# Patient Record
Sex: Male | Born: 1985 | Hispanic: Yes | Marital: Single | Smoking: Never smoker
Health system: Southern US, Community
[De-identification: ages and names within clinical notes are randomized; demographics above are authoritative.]

---

## 2015-10-01 DIAGNOSIS — Z6824 Body mass index (BMI) 24.0-24.9, adult: Secondary | ICD-10-CM | POA: Diagnosis not present

## 2015-10-01 DIAGNOSIS — T7840XA Allergy, unspecified, initial encounter: Secondary | ICD-10-CM | POA: Diagnosis not present

## 2015-10-01 DIAGNOSIS — B009 Herpesviral infection, unspecified: Secondary | ICD-10-CM | POA: Diagnosis not present

## 2016-01-29 DIAGNOSIS — F329 Major depressive disorder, single episode, unspecified: Secondary | ICD-10-CM | POA: Diagnosis not present

## 2016-01-29 DIAGNOSIS — F419 Anxiety disorder, unspecified: Secondary | ICD-10-CM | POA: Diagnosis not present

## 2016-02-05 DIAGNOSIS — F419 Anxiety disorder, unspecified: Secondary | ICD-10-CM | POA: Diagnosis not present

## 2016-02-05 DIAGNOSIS — F329 Major depressive disorder, single episode, unspecified: Secondary | ICD-10-CM | POA: Diagnosis not present

## 2016-02-12 DIAGNOSIS — F329 Major depressive disorder, single episode, unspecified: Secondary | ICD-10-CM | POA: Diagnosis not present

## 2016-02-12 DIAGNOSIS — F419 Anxiety disorder, unspecified: Secondary | ICD-10-CM | POA: Diagnosis not present

## 2016-02-19 DIAGNOSIS — F419 Anxiety disorder, unspecified: Secondary | ICD-10-CM | POA: Diagnosis not present

## 2016-02-19 DIAGNOSIS — F329 Major depressive disorder, single episode, unspecified: Secondary | ICD-10-CM | POA: Diagnosis not present

## 2016-03-03 DIAGNOSIS — M5408 Panniculitis affecting regions of neck and back, sacral and sacrococcygeal region: Secondary | ICD-10-CM | POA: Diagnosis not present

## 2016-03-03 DIAGNOSIS — M9903 Segmental and somatic dysfunction of lumbar region: Secondary | ICD-10-CM | POA: Diagnosis not present

## 2016-03-03 DIAGNOSIS — M9902 Segmental and somatic dysfunction of thoracic region: Secondary | ICD-10-CM | POA: Diagnosis not present

## 2016-03-03 DIAGNOSIS — M62838 Other muscle spasm: Secondary | ICD-10-CM | POA: Diagnosis not present

## 2016-03-04 DIAGNOSIS — F419 Anxiety disorder, unspecified: Secondary | ICD-10-CM | POA: Diagnosis not present

## 2016-03-04 DIAGNOSIS — F329 Major depressive disorder, single episode, unspecified: Secondary | ICD-10-CM | POA: Diagnosis not present

## 2016-03-10 DIAGNOSIS — M62838 Other muscle spasm: Secondary | ICD-10-CM | POA: Diagnosis not present

## 2016-03-10 DIAGNOSIS — M5408 Panniculitis affecting regions of neck and back, sacral and sacrococcygeal region: Secondary | ICD-10-CM | POA: Diagnosis not present

## 2016-03-10 DIAGNOSIS — M9902 Segmental and somatic dysfunction of thoracic region: Secondary | ICD-10-CM | POA: Diagnosis not present

## 2016-03-10 DIAGNOSIS — M9903 Segmental and somatic dysfunction of lumbar region: Secondary | ICD-10-CM | POA: Diagnosis not present

## 2016-03-11 DIAGNOSIS — F419 Anxiety disorder, unspecified: Secondary | ICD-10-CM | POA: Diagnosis not present

## 2016-03-11 DIAGNOSIS — F329 Major depressive disorder, single episode, unspecified: Secondary | ICD-10-CM | POA: Diagnosis not present

## 2016-03-16 DIAGNOSIS — J3089 Other allergic rhinitis: Secondary | ICD-10-CM | POA: Diagnosis not present

## 2016-03-16 DIAGNOSIS — J029 Acute pharyngitis, unspecified: Secondary | ICD-10-CM | POA: Diagnosis not present

## 2016-03-17 DIAGNOSIS — M62838 Other muscle spasm: Secondary | ICD-10-CM | POA: Diagnosis not present

## 2016-03-17 DIAGNOSIS — M5408 Panniculitis affecting regions of neck and back, sacral and sacrococcygeal region: Secondary | ICD-10-CM | POA: Diagnosis not present

## 2016-03-17 DIAGNOSIS — M9902 Segmental and somatic dysfunction of thoracic region: Secondary | ICD-10-CM | POA: Diagnosis not present

## 2016-03-17 DIAGNOSIS — M9903 Segmental and somatic dysfunction of lumbar region: Secondary | ICD-10-CM | POA: Diagnosis not present

## 2016-03-31 DIAGNOSIS — M62838 Other muscle spasm: Secondary | ICD-10-CM | POA: Diagnosis not present

## 2016-03-31 DIAGNOSIS — M5408 Panniculitis affecting regions of neck and back, sacral and sacrococcygeal region: Secondary | ICD-10-CM | POA: Diagnosis not present

## 2016-03-31 DIAGNOSIS — M9903 Segmental and somatic dysfunction of lumbar region: Secondary | ICD-10-CM | POA: Diagnosis not present

## 2016-03-31 DIAGNOSIS — M9902 Segmental and somatic dysfunction of thoracic region: Secondary | ICD-10-CM | POA: Diagnosis not present

## 2016-04-01 DIAGNOSIS — F419 Anxiety disorder, unspecified: Secondary | ICD-10-CM | POA: Diagnosis not present

## 2016-04-01 DIAGNOSIS — F329 Major depressive disorder, single episode, unspecified: Secondary | ICD-10-CM | POA: Diagnosis not present

## 2016-04-07 DIAGNOSIS — M9902 Segmental and somatic dysfunction of thoracic region: Secondary | ICD-10-CM | POA: Diagnosis not present

## 2016-04-07 DIAGNOSIS — M62838 Other muscle spasm: Secondary | ICD-10-CM | POA: Diagnosis not present

## 2016-04-07 DIAGNOSIS — M5408 Panniculitis affecting regions of neck and back, sacral and sacrococcygeal region: Secondary | ICD-10-CM | POA: Diagnosis not present

## 2016-04-07 DIAGNOSIS — M9903 Segmental and somatic dysfunction of lumbar region: Secondary | ICD-10-CM | POA: Diagnosis not present

## 2016-04-14 DIAGNOSIS — M9902 Segmental and somatic dysfunction of thoracic region: Secondary | ICD-10-CM | POA: Diagnosis not present

## 2016-04-14 DIAGNOSIS — M62838 Other muscle spasm: Secondary | ICD-10-CM | POA: Diagnosis not present

## 2016-04-14 DIAGNOSIS — M5408 Panniculitis affecting regions of neck and back, sacral and sacrococcygeal region: Secondary | ICD-10-CM | POA: Diagnosis not present

## 2016-04-14 DIAGNOSIS — M9903 Segmental and somatic dysfunction of lumbar region: Secondary | ICD-10-CM | POA: Diagnosis not present

## 2016-04-15 DIAGNOSIS — F419 Anxiety disorder, unspecified: Secondary | ICD-10-CM | POA: Diagnosis not present

## 2016-04-15 DIAGNOSIS — F329 Major depressive disorder, single episode, unspecified: Secondary | ICD-10-CM | POA: Diagnosis not present

## 2016-04-21 DIAGNOSIS — M9902 Segmental and somatic dysfunction of thoracic region: Secondary | ICD-10-CM | POA: Diagnosis not present

## 2016-04-21 DIAGNOSIS — M5408 Panniculitis affecting regions of neck and back, sacral and sacrococcygeal region: Secondary | ICD-10-CM | POA: Diagnosis not present

## 2016-04-21 DIAGNOSIS — M62838 Other muscle spasm: Secondary | ICD-10-CM | POA: Diagnosis not present

## 2016-04-21 DIAGNOSIS — M9903 Segmental and somatic dysfunction of lumbar region: Secondary | ICD-10-CM | POA: Diagnosis not present

## 2016-04-29 DIAGNOSIS — F329 Major depressive disorder, single episode, unspecified: Secondary | ICD-10-CM | POA: Diagnosis not present

## 2016-04-29 DIAGNOSIS — F419 Anxiety disorder, unspecified: Secondary | ICD-10-CM | POA: Diagnosis not present

## 2016-04-30 DIAGNOSIS — M62838 Other muscle spasm: Secondary | ICD-10-CM | POA: Diagnosis not present

## 2016-04-30 DIAGNOSIS — M9902 Segmental and somatic dysfunction of thoracic region: Secondary | ICD-10-CM | POA: Diagnosis not present

## 2016-04-30 DIAGNOSIS — M5408 Panniculitis affecting regions of neck and back, sacral and sacrococcygeal region: Secondary | ICD-10-CM | POA: Diagnosis not present

## 2016-04-30 DIAGNOSIS — M9903 Segmental and somatic dysfunction of lumbar region: Secondary | ICD-10-CM | POA: Diagnosis not present

## 2016-05-05 DIAGNOSIS — M9903 Segmental and somatic dysfunction of lumbar region: Secondary | ICD-10-CM | POA: Diagnosis not present

## 2016-05-05 DIAGNOSIS — M62838 Other muscle spasm: Secondary | ICD-10-CM | POA: Diagnosis not present

## 2016-05-05 DIAGNOSIS — M5408 Panniculitis affecting regions of neck and back, sacral and sacrococcygeal region: Secondary | ICD-10-CM | POA: Diagnosis not present

## 2016-05-05 DIAGNOSIS — M9902 Segmental and somatic dysfunction of thoracic region: Secondary | ICD-10-CM | POA: Diagnosis not present

## 2016-05-12 DIAGNOSIS — M5408 Panniculitis affecting regions of neck and back, sacral and sacrococcygeal region: Secondary | ICD-10-CM | POA: Diagnosis not present

## 2016-05-12 DIAGNOSIS — M62838 Other muscle spasm: Secondary | ICD-10-CM | POA: Diagnosis not present

## 2016-05-12 DIAGNOSIS — M9902 Segmental and somatic dysfunction of thoracic region: Secondary | ICD-10-CM | POA: Diagnosis not present

## 2016-05-12 DIAGNOSIS — M9903 Segmental and somatic dysfunction of lumbar region: Secondary | ICD-10-CM | POA: Diagnosis not present

## 2016-05-13 DIAGNOSIS — F329 Major depressive disorder, single episode, unspecified: Secondary | ICD-10-CM | POA: Diagnosis not present

## 2016-05-13 DIAGNOSIS — F419 Anxiety disorder, unspecified: Secondary | ICD-10-CM | POA: Diagnosis not present

## 2016-07-26 ENCOUNTER — Encounter (HOSPITAL_COMMUNITY)
Admission: EM | Disposition: A | Discharge status: Home / Self Care | Payer: Self-pay | Source: Home / Self Care | Attending: Emergency Medicine

## 2016-07-26 ENCOUNTER — Observation Stay (HOSPITAL_COMMUNITY)
Admission: EM | Admit: 2016-07-26 | Discharge: 2016-07-27 | Disposition: A | Discharge status: Home / Self Care | Payer: BLUE CROSS/BLUE SHIELD | Attending: General Surgery | Admitting: General Surgery

## 2016-07-26 ENCOUNTER — Emergency Department (HOSPITAL_COMMUNITY): Payer: BLUE CROSS/BLUE SHIELD

## 2016-07-26 ENCOUNTER — Emergency Department (HOSPITAL_COMMUNITY): Payer: BLUE CROSS/BLUE SHIELD | Admitting: Anesthesiology

## 2016-07-26 ENCOUNTER — Encounter (HOSPITAL_COMMUNITY): Payer: Self-pay | Admitting: Emergency Medicine

## 2016-07-26 DIAGNOSIS — K353 Acute appendicitis with localized peritonitis, without perforation or gangrene: Secondary | ICD-10-CM

## 2016-07-26 DIAGNOSIS — R10811 Right upper quadrant abdominal tenderness: Secondary | ICD-10-CM

## 2016-07-26 DIAGNOSIS — R109 Unspecified abdominal pain: Secondary | ICD-10-CM | POA: Diagnosis not present

## 2016-07-26 DIAGNOSIS — R1110 Vomiting, unspecified: Secondary | ICD-10-CM | POA: Diagnosis not present

## 2016-07-26 DIAGNOSIS — R1011 Right upper quadrant pain: Secondary | ICD-10-CM | POA: Diagnosis not present

## 2016-07-26 DIAGNOSIS — K37 Unspecified appendicitis: Secondary | ICD-10-CM | POA: Diagnosis not present

## 2016-07-26 DIAGNOSIS — R112 Nausea with vomiting, unspecified: Secondary | ICD-10-CM | POA: Diagnosis not present

## 2016-07-26 DIAGNOSIS — K3580 Unspecified acute appendicitis: Principal | ICD-10-CM | POA: Insufficient documentation

## 2016-07-26 HISTORY — PX: LAPAROSCOPIC APPENDECTOMY: SHX408

## 2016-07-26 LAB — COMPREHENSIVE METABOLIC PANEL
ALT: 47 U/L (ref 17–63)
AST: 32 U/L (ref 15–41)
Albumin: 4.7 g/dL (ref 3.5–5.0)
Alkaline Phosphatase: 68 U/L (ref 38–126)
Anion gap: 11 (ref 5–15)
BUN: 18 mg/dL (ref 6–20)
CO2: 22 mmol/L (ref 22–32)
Calcium: 9.8 mg/dL (ref 8.9–10.3)
Chloride: 104 mmol/L (ref 101–111)
Creatinine, Ser: 0.81 mg/dL (ref 0.61–1.24)
GFR calc Af Amer: >60 mL/min (ref >60)
GFR calc non Af Amer: >60 mL/min (ref >60)
Glucose, Bld: 127 mg/dL — ABNORMAL HIGH (ref 65–99)
Potassium: 3.6 mmol/L (ref 3.5–5.1)
Sodium: 137 mmol/L (ref 135–145)
Total Bilirubin: 0.5 mg/dL (ref 0.3–1.2)
Total Protein: 8.1 g/dL (ref 6.5–8.1)

## 2016-07-26 LAB — URINALYSIS, ROUTINE W REFLEX MICROSCOPIC
Bilirubin Urine: NEGATIVE
Glucose, UA: NEGATIVE mg/dL
Hgb urine dipstick: NEGATIVE
Ketones, ur: 5 mg/dL — AB
Leukocytes, UA: NEGATIVE
Nitrite: NEGATIVE
Protein, ur: NEGATIVE mg/dL
Specific Gravity, Urine: 1.027 (ref 1.005–1.030)
pH: 8 (ref 5.0–8.0)

## 2016-07-26 LAB — CBC
HCT: 44.6 % (ref 39.0–52.0)
Hemoglobin: 15.8 g/dL (ref 13.0–17.0)
MCH: 29.3 pg (ref 26.0–34.0)
MCHC: 35.4 g/dL (ref 30.0–36.0)
MCV: 82.7 fL (ref 78.0–100.0)
Platelets: 275 10*3/uL (ref 150–400)
RBC: 5.39 MIL/uL (ref 4.22–5.81)
RDW: 12.3 % (ref 11.5–15.5)
WBC: 22.2 10*3/uL — ABNORMAL HIGH (ref 4.0–10.5)

## 2016-07-26 LAB — LIPASE, BLOOD: Lipase: 31 U/L (ref 11–51)

## 2016-07-26 LAB — RAPID STREP SCREEN (MED CTR MEBANE ONLY): Streptococcus, Group A Screen (Direct): NEGATIVE

## 2016-07-26 SURGERY — APPENDECTOMY, LAPAROSCOPIC
Anesthesia: General | Site: Abdomen

## 2016-07-26 MED ORDER — ONDANSETRON HCL 4 MG/2ML IJ SOLN
4.0000 mg | Freq: Four times a day (QID) | INTRAMUSCULAR | Status: DC | PRN
  Administered 2016-07-27: 4 mg via INTRAVENOUS
  Filled 2016-07-26: qty 2

## 2016-07-26 MED ORDER — ROCURONIUM BROMIDE 50 MG/5ML IV SOSY
PREFILLED_SYRINGE | INTRAVENOUS | Status: AC
  Filled 2016-07-26: qty 15

## 2016-07-26 MED ORDER — 0.9 % SODIUM CHLORIDE (POUR BTL) OPTIME
TOPICAL | Status: DC | PRN
  Administered 2016-07-26: 1000 mL

## 2016-07-26 MED ORDER — SUCCINYLCHOLINE CHLORIDE 200 MG/10ML IV SOSY
PREFILLED_SYRINGE | INTRAVENOUS | Status: AC
  Filled 2016-07-26: qty 10

## 2016-07-26 MED ORDER — BUPIVACAINE HCL (PF) 0.25 % IJ SOLN
INTRAMUSCULAR | Status: AC
  Filled 2016-07-26: qty 30

## 2016-07-26 MED ORDER — SODIUM CHLORIDE 0.9 % IV BOLUS (SEPSIS)
1000.0000 mL | Freq: Once | INTRAVENOUS | Status: AC
  Administered 2016-07-26: 1000 mL via INTRAVENOUS

## 2016-07-26 MED ORDER — ROCURONIUM BROMIDE 100 MG/10ML IV SOLN
INTRAVENOUS | Status: DC | PRN
  Administered 2016-07-26: 35 mg via INTRAVENOUS

## 2016-07-26 MED ORDER — MIDAZOLAM HCL 2 MG/2ML IJ SOLN
INTRAMUSCULAR | Status: AC
  Filled 2016-07-26: qty 2

## 2016-07-26 MED ORDER — ONDANSETRON HCL 4 MG/2ML IJ SOLN
INTRAMUSCULAR | Status: DC | PRN
  Administered 2016-07-26: 4 mg via INTRAVENOUS

## 2016-07-26 MED ORDER — DOCUSATE SODIUM 100 MG PO CAPS
100.0000 mg | ORAL_CAPSULE | Freq: Two times a day (BID) | ORAL | Status: DC
  Administered 2016-07-26 – 2016-07-27 (×2): 100 mg via ORAL
  Filled 2016-07-26 (×2): qty 1

## 2016-07-26 MED ORDER — ONDANSETRON 4 MG PO TBDP: 4.0000 mg | ORAL_TABLET | Freq: Four times a day (QID) | ORAL | Status: DC | PRN

## 2016-07-26 MED ORDER — PIPERACILLIN-TAZOBACTAM 3.375 G IVPB
3.3750 g | Freq: Once | INTRAVENOUS | Status: AC
  Administered 2016-07-26: 3.375 g via INTRAVENOUS
  Filled 2016-07-26: qty 50

## 2016-07-26 MED ORDER — ENOXAPARIN SODIUM 40 MG/0.4ML ~~LOC~~ SOLN
40.0000 mg | SUBCUTANEOUS | Status: DC
  Administered 2016-07-27: 40 mg via SUBCUTANEOUS
  Filled 2016-07-26: qty 0.4

## 2016-07-26 MED ORDER — LIDOCAINE HCL (CARDIAC) 20 MG/ML IV SOLN
INTRAVENOUS | Status: DC | PRN
  Administered 2016-07-26: 60 mg via INTRAVENOUS

## 2016-07-26 MED ORDER — MORPHINE SULFATE (PF) 2 MG/ML IV SOLN: 2.0000 mg | INTRAVENOUS | Status: DC | PRN

## 2016-07-26 MED ORDER — ONDANSETRON HCL 4 MG/2ML IJ SOLN
INTRAMUSCULAR | Status: AC
  Filled 2016-07-26: qty 2

## 2016-07-26 MED ORDER — SODIUM CHLORIDE 0.9 % IR SOLN
Status: DC | PRN
  Administered 2016-07-26: 1000 mL

## 2016-07-26 MED ORDER — OXYCODONE HCL 5 MG PO TABS
5.0000 mg | ORAL_TABLET | ORAL | Status: DC | PRN
  Administered 2016-07-27: 10 mg via ORAL
  Filled 2016-07-26: qty 2

## 2016-07-26 MED ORDER — FENTANYL CITRATE (PF) 100 MCG/2ML IJ SOLN
INTRAMUSCULAR | Status: DC | PRN
  Administered 2016-07-26: 100 ug via INTRAVENOUS
  Administered 2016-07-26: 50 ug via INTRAVENOUS

## 2016-07-26 MED ORDER — SUGAMMADEX SODIUM 200 MG/2ML IV SOLN
INTRAVENOUS | Status: DC | PRN
  Administered 2016-07-26: 200 mg via INTRAVENOUS

## 2016-07-26 MED ORDER — BUPIVACAINE-EPINEPHRINE 0.25% -1:200000 IJ SOLN
INTRAMUSCULAR | Status: DC | PRN
  Administered 2016-07-26: 10 mL

## 2016-07-26 MED ORDER — LACTATED RINGERS IV SOLN
INTRAVENOUS | Status: DC | PRN
  Administered 2016-07-26 (×2): via INTRAVENOUS

## 2016-07-26 MED ORDER — FENTANYL CITRATE (PF) 100 MCG/2ML IJ SOLN
INTRAMUSCULAR | Status: AC
Start: 2016-07-26 — End: 2016-07-26
  Filled 2016-07-26: qty 4

## 2016-07-26 MED ORDER — PROPOFOL 10 MG/ML IV BOLUS
INTRAVENOUS | Status: DC | PRN
  Administered 2016-07-26: 125 mg via INTRAVENOUS

## 2016-07-26 MED ORDER — SUGAMMADEX SODIUM 200 MG/2ML IV SOLN
INTRAVENOUS | Status: AC
  Filled 2016-07-26: qty 4

## 2016-07-26 MED ORDER — SUCCINYLCHOLINE CHLORIDE 20 MG/ML IJ SOLN
INTRAMUSCULAR | Status: DC | PRN
  Administered 2016-07-26: 100 mg via INTRAVENOUS

## 2016-07-26 MED ORDER — LORATADINE 10 MG PO TABS
10.0000 mg | ORAL_TABLET | Freq: Every day | ORAL | Status: DC
  Administered 2016-07-26: 10 mg via ORAL
  Filled 2016-07-26: qty 1

## 2016-07-26 MED ORDER — POTASSIUM CHLORIDE IN NACL 20-0.45 MEQ/L-% IV SOLN
INTRAVENOUS | Status: DC
  Administered 2016-07-27 (×2): via INTRAVENOUS
  Filled 2016-07-26 (×4): qty 1000

## 2016-07-26 MED ORDER — LIDOCAINE 2% (20 MG/ML) 5 ML SYRINGE
INTRAMUSCULAR | Status: AC
  Filled 2016-07-26: qty 5

## 2016-07-26 MED ORDER — LORATADINE 10 MG PO TABS: 10.0000 mg | ORAL_TABLET | Freq: Every day | ORAL | Status: DC

## 2016-07-26 MED ORDER — MIDAZOLAM HCL 5 MG/5ML IJ SOLN
INTRAMUSCULAR | Status: DC | PRN
  Administered 2016-07-26: 2 mg via INTRAVENOUS

## 2016-07-26 MED ORDER — IOPAMIDOL (ISOVUE-300) INJECTION 61%
INTRAVENOUS | Status: AC
  Administered 2016-07-26: 100 mL
  Filled 2016-07-26: qty 100

## 2016-07-26 MED ORDER — HYDROMORPHONE HCL 1 MG/ML IJ SOLN
INTRAMUSCULAR | Status: AC
  Filled 2016-07-26: qty 1

## 2016-07-26 MED ORDER — PIPERACILLIN-TAZOBACTAM 3.375 G IVPB 30 MIN
3.3750 g | Freq: Once | INTRAVENOUS | Status: AC
  Administered 2016-07-26: 3.375 g via INTRAVENOUS
  Filled 2016-07-26: qty 50

## 2016-07-26 MED ORDER — HYDROCODONE-ACETAMINOPHEN 10-325 MG PO TABS
0.5000 | ORAL_TABLET | ORAL | Status: DC | PRN
  Administered 2016-07-26 – 2016-07-27 (×2): 2 via ORAL
  Filled 2016-07-26 (×2): qty 2

## 2016-07-26 MED ORDER — HYDROMORPHONE HCL 1 MG/ML IJ SOLN
0.2500 mg | INTRAMUSCULAR | Status: DC | PRN
  Administered 2016-07-26 (×2): 0.5 mg via INTRAVENOUS

## 2016-07-26 SURGICAL SUPPLY — 41 items
APPLIER CLIP ROT 10 11.4 M/L (STAPLE)
BLADE SURG ROTATE 9660 (MISCELLANEOUS) IMPLANT
CANISTER SUCTION 2500CC (MISCELLANEOUS) ×2 IMPLANT
CHLORAPREP W/TINT 26ML (MISCELLANEOUS) ×2 IMPLANT
CLIP APPLIE ROT 10 11.4 M/L (STAPLE) IMPLANT
COVER SURGICAL LIGHT HANDLE (MISCELLANEOUS) ×2 IMPLANT
CUTTER FLEX LINEAR 45M (STAPLE) ×2 IMPLANT
DERMABOND ADHESIVE PROPEN (GAUZE/BANDAGES/DRESSINGS) ×1
DERMABOND ADVANCED (GAUZE/BANDAGES/DRESSINGS) ×1
DERMABOND ADVANCED .7 DNX12 (GAUZE/BANDAGES/DRESSINGS) ×1 IMPLANT
DERMABOND ADVANCED .7 DNX6 (GAUZE/BANDAGES/DRESSINGS) ×1 IMPLANT
ELECT REM PT RETURN 9FT ADLT (ELECTROSURGICAL) ×2
ELECTRODE REM PT RTRN 9FT ADLT (ELECTROSURGICAL) ×1 IMPLANT
GLOVE BIO SURGEON STRL SZ8 (GLOVE) ×2 IMPLANT
GLOVE BIOGEL PI IND STRL 8 (GLOVE) ×1 IMPLANT
GLOVE BIOGEL PI INDICATOR 8 (GLOVE) ×1
GOWN STRL REUS W/ TWL LRG LVL3 (GOWN DISPOSABLE) ×2 IMPLANT
GOWN STRL REUS W/ TWL XL LVL3 (GOWN DISPOSABLE) ×1 IMPLANT
GOWN STRL REUS W/TWL LRG LVL3 (GOWN DISPOSABLE) ×2
GOWN STRL REUS W/TWL XL LVL3 (GOWN DISPOSABLE) ×1
KIT BASIN OR (CUSTOM PROCEDURE TRAY) ×2 IMPLANT
KIT ROOM TURNOVER OR (KITS) ×2 IMPLANT
NEEDLE 22X1 1/2 (OR ONLY) (NEEDLE) ×2 IMPLANT
NS IRRIG 1000ML POUR BTL (IV SOLUTION) ×2 IMPLANT
PAD ARMBOARD 7.5X6 YLW CONV (MISCELLANEOUS) ×4 IMPLANT
POUCH SPECIMEN RETRIEVAL 10MM (ENDOMECHANICALS) ×2 IMPLANT
RELOAD 45 VASCULAR/THIN (ENDOMECHANICALS) ×2 IMPLANT
RELOAD STAPLE TA45 3.5 REG BLU (ENDOMECHANICALS) IMPLANT
SCALPEL HARMONIC ACE (MISCELLANEOUS) ×2 IMPLANT
SCISSORS LAP 5X35 DISP (ENDOMECHANICALS) IMPLANT
SET IRRIG TUBING LAPAROSCOPIC (IRRIGATION / IRRIGATOR) ×2 IMPLANT
SPECIMEN JAR SMALL (MISCELLANEOUS) ×2 IMPLANT
SUT VIC AB 4-0 PS2 27 (SUTURE) ×2 IMPLANT
TOWEL OR 17X24 6PK STRL BLUE (TOWEL DISPOSABLE) ×2 IMPLANT
TOWEL OR 17X26 10 PK STRL BLUE (TOWEL DISPOSABLE) ×2 IMPLANT
TRAY FOLEY CATH 16FR SILVER (SET/KITS/TRAYS/PACK) ×2 IMPLANT
TRAY LAPAROSCOPIC MC (CUSTOM PROCEDURE TRAY) ×2 IMPLANT
TROCAR XCEL 12X100 BLDLESS (ENDOMECHANICALS) ×2 IMPLANT
TROCAR XCEL BLUNT TIP 100MML (ENDOMECHANICALS) ×2 IMPLANT
TROCAR XCEL NON-BLD 5MMX100MML (ENDOMECHANICALS) ×2 IMPLANT
TUBING INSUFFLATION (TUBING) ×2 IMPLANT

## 2016-07-26 NOTE — Anesthesia Postprocedure Evaluation (Signed)
Anesthesia Post Note  Patient: Lillie FragminJorge Brosious  Procedure(s) Performed: Procedure(s) (LRB): APPENDECTOMY LAPAROSCOPIC (N/A)  Patient location during evaluation: PACU Anesthesia Type: General Level of consciousness: awake and alert Pain management: pain level controlled Vital Signs Assessment: post-procedure vital signs reviewed and stable Respiratory status: spontaneous breathing, nonlabored ventilation and respiratory function stable Cardiovascular status: blood pressure returned to baseline and stable Postop Assessment: no signs of nausea or vomiting Anesthetic complications: no       Last Vitals:  Vitals:   07/26/16 1100 07/26/16 1130  BP: 113/65 105/67  Pulse: 86 92  Resp:  16  Temp:      Last Pain:  Vitals:   07/26/16 0807  TempSrc: Oral  PainSc:                  Jojo Geving,W. EDMOND

## 2016-07-26 NOTE — ED Provider Notes (Signed)
  Physical Exam  BP 96/59   Pulse 94   Temp 99.3 F (37.4 C) (Oral)   Resp 18   Ht 5\' 4"  (1.626 m)   Wt 130 lb (59 kg)   SpO2 99%   BMI 22.31 kg/m   Physical Exam  ED Course  Procedures  MDM  Received patient in signout. CT scan showed likely appendicitis. Discussed with general surgery, who will see patient. Zosyn given due to metronidazole shortage      Benjiman CoreNathan Helia Haese, MD 07/26/16 [phone]

## 2016-07-26 NOTE — ED Provider Notes (Addendum)
[facility]-EMERGENCY DEPT Provider Note   CSN: [id number] Arrival date & time: 07/26/16  0344     History   Chief Complaint Chief Complaint  Patient presents with  . Abdominal Pain  . Emesis    HPI Harry Ortiz is a 31 y.o. male.  HPI Pt comes in with cc of abd pain. Pt has no medical hx. He is taking penicillin for a sore throat right now. Pt reports that he started having severe abd pain around midninght when he was trying to go sleep. Pain was generalized and severe. Pt has associated nausea, and emesis x 4, no blood and 1 episode of loose BM. Pt has had no hx of similar pain. While in the ER, the pain has improved dramatically.   History reviewed. No pertinent past medical history.  There are no active problems to display for this patient.   History reviewed. No pertinent surgical history.     Home Medications    Prior to Admission medications   Medication Sig Start Date End Date Taking? Authorizing Provider  loratadine (CLARITIN) 10 MG tablet Take 10 mg by mouth daily.   Yes Historical Provider, MD  penicillin v potassium (VEETID) 250 MG tablet Take 250 mg by mouth daily.   Yes Historical Provider, MD    Family History History reviewed. No pertinent family history.  Social History Social History  Substance Use Topics  . Smoking status: Never Smoker  . Smokeless tobacco: Never Used  . Alcohol use Yes     Allergies   Patient has no known allergies.   Review of Systems Review of Systems   ROS 10 Systems reviewed and are negative for acute change except as noted in the HPI.     Physical Exam Updated Vital Signs BP 113/68 (BP Location: Right Arm)   Pulse 84   Temp 99.3 F (37.4 C) (Oral)   Resp 18   Ht 5\' 4"  (1.626 m)   Wt 130 lb (59 kg)   SpO2 97%   BMI 22.31 kg/m   Physical Exam  Constitutional: He is oriented to person, place, and time. He appears well-developed.  HENT:  Head: Atraumatic.  Neck: Neck supple.  Cardiovascular: Normal  rate.   Pulmonary/Chest: Effort normal.  Abdominal: Soft. There is tenderness. There is guarding.  Generalized tenderness, worst over the RUQ. + guarding  Neurological: He is alert and oriented to person, place, and time.  Skin: Skin is warm.  Nursing note and vitals reviewed.    ED Treatments / Results  Labs (all labs ordered are listed, but only abnormal results are displayed) Labs Reviewed  COMPREHENSIVE METABOLIC PANEL - Abnormal; Notable for the following:       Result Value   Glucose, Bld 127 (*)    All other components within normal limits  CBC - Abnormal; Notable for the following:    WBC 22.2 (*)    All other components within normal limits  URINALYSIS, ROUTINE W REFLEX MICROSCOPIC - Abnormal; Notable for the following:    Ketones, ur 5 (*)    All other components within normal limits  RAPID STREP SCREEN (NOT AT [facility])  CULTURE, GROUP A STREP ([facility])  LIPASE, BLOOD    EKG  EKG Interpretation None       Radiology [place] Abdomen Limited Ruq  Result Date: 07/26/2016 CLINICAL DATA:  Right upper quadrant pain, nausea, vomiting EXAM: US ABDOMEN LIMITED - RIGHT UPPER QUADRANT COMPARISON:  None. FINDINGS: Gallbladder: No gallstones or wall thickening visualized. No sonographic  Murphy sign noted by sonographer. Common bile duct: Diameter: Normal caliber, 3 mm Liver: No focal lesion identified. Within normal limits in parenchymal echogenicity. IMPRESSION: Normal study Electronically Signed   By: Charlett Nose M.D.   On: 07/26/2016 07:07    Procedures Procedures (including critical care time)  Medications Ordered in ED Medications  iopamidol (ISOVUE-300) 61 % injection (not administered)  sodium chloride 0.9 % bolus 1,000 mL (0 mLs Intravenous Stopped 07/26/16 0713)     Initial Impression / Assessment and Plan / ED Course  I have reviewed the triage vital signs and the nursing notes.  Pertinent labs & imaging results that were available during my care of the patient were  reviewed by me and considered in my medical decision making (see chart for details).  Clinical Course as of Jul 26 829  Wynelle Link Jul 26, 2016  1324 [facility] is the only SIRS. Pt's RR is 14 when I saw him, w/o any intervention, and so singular RR of 22 at triage was likely due to him walking in and breathing heavy as he was in pain. We will wait for proper workup before considering sepsis workup. WBC: (!) 22.2 [AN]  [id number] All labs reviewed. Pt is in [place]. Plan is to get [place], if neg, we will reassess and decide if CT is needed.   [AN]  0830 Pt has worse pain in the RLQ now - so CT ordered. Dr. Rubin Payor will f/u.  [AN]    Clinical Course User Index [AN] Derwood Kaplan, MD    Pt comes in with cc of abd pain. DDx includes: Pancreatitis Hepatobiliary pathology including cholecystitis Gastroenteritis  Based on hx and exam, we will get GI labs and [place].   Final Clinical Impressions(s) / ED Diagnoses   Final diagnoses:  RUQ abdominal tenderness    New Prescriptions New Prescriptions   No medications on file     Derwood Kaplan, MD 07/26/16 4010    Derwood Kaplan, MD 07/26/16 2725

## 2016-07-26 NOTE — H&P (Signed)
Harry Ortiz is an 31 y.o. male.   Chief Complaint: Abd pain HPI: Harry Ortiz had actue onset periumbilical pain beginning last night around midnight. It was severe and associated with N/V and a little diarrhea. He also had chills. No prior e/o. He sought care in the ED and an abd CT showed appendicitis. His pain abated around 6 this am.  History reviewed. No pertinent past medical history.  History reviewed. No pertinent surgical history.  History reviewed. No pertinent family history. Social History:  reports that he has never smoked. He has never used smokeless tobacco. He reports that he drinks alcohol. He reports that he does not use drugs.  Allergies: No Known Allergies   Results for orders placed or performed during the hospital encounter of 07/26/16 (from the past 48 hour(s))  Lipase, blood     Status: None   Collection Time: 07/26/16  4:00 AM  Result Value Ref Range   Lipase 31 11 - 51 U/L  Comprehensive metabolic panel     Status: Abnormal   Collection Time: 07/26/16  4:00 AM  Result Value Ref Range   Sodium 137 135 - 145 mmol/L   Potassium 3.6 3.5 - 5.1 mmol/L   Chloride 104 101 - 111 mmol/L   CO2 22 22 - 32 mmol/L   Glucose, Bld 127 (H) 65 - 99 mg/dL   BUN 18 6 - 20 mg/dL   Creatinine, Ser 0.03 0.61 - 1.24 mg/dL   Calcium 9.8 8.9 - 60.1 mg/dL   Total Protein 8.1 6.5 - 8.1 g/dL   Albumin 4.7 3.5 - 5.0 g/dL   AST 32 15 - 41 U/L   ALT 47 17 - 63 U/L   Alkaline Phosphatase 68 38 - 126 U/L   Total Bilirubin 0.5 0.3 - 1.2 mg/dL   GFR calc non Af Amer >60 >60 mL/min   GFR calc Af Amer >60 >60 mL/min    Comment: (NOTE) The eGFR has been calculated using the CKD EPI equation. This calculation has not been validated in all clinical situations. eGFR's persistently <60 mL/min signify possible Chronic Kidney Disease.    Anion gap 11 5 - 15  CBC     Status: Abnormal   Collection Time: 07/26/16  4:00 AM  Result Value Ref Range   WBC 22.2 (H) 4.0 - 10.5 K/uL   RBC 5.39 4.22 -  5.81 MIL/uL   Hemoglobin 15.8 13.0 - 17.0 g/dL   HCT 12.6 86.4 - 85.9 %   MCV 82.7 78.0 - 100.0 fL   MCH 29.3 26.0 - 34.0 pg   MCHC 35.4 30.0 - 36.0 g/dL   RDW 84.8 85.8 - 41.8 %   Platelets 275 150 - 400 K/uL  Urinalysis, Routine w reflex microscopic     Status: Abnormal   Collection Time: 07/26/16  4:03 AM  Result Value Ref Range   Color, Urine YELLOW YELLOW   APPearance CLEAR CLEAR   Specific Gravity, Urine 1.027 1.005 - 1.030   pH 8.0 5.0 - 8.0   Glucose, UA NEGATIVE NEGATIVE mg/dL   Hgb urine dipstick NEGATIVE NEGATIVE   Bilirubin Urine NEGATIVE NEGATIVE   Ketones, ur 5 (A) NEGATIVE mg/dL   Protein, ur NEGATIVE NEGATIVE mg/dL   Nitrite NEGATIVE NEGATIVE   Leukocytes, UA NEGATIVE NEGATIVE  Rapid strep screen     Status: None   Collection Time: 07/26/16  6:45 AM  Result Value Ref Range   Streptococcus, Group A Screen (Direct) NEGATIVE NEGATIVE    Comment: (NOTE)  A Rapid Antigen test may result negative if the antigen level in the sample is below the detection level of this test. The FDA has not cleared this test as a stand-alone test therefore the rapid antigen negative result has reflexed to a Group A Strep culture.    Ct Abdomen Pelvis W Contrast  Result Date: 07/26/2016 CLINICAL DATA:  Abdominal pain all over, vomiting, chills EXAM: CT ABDOMEN AND PELVIS WITH CONTRAST TECHNIQUE: Multidetector CT imaging of the abdomen and pelvis was performed using the standard protocol following bolus administration of intravenous contrast. CONTRAST:  ISOVUE-300 IOPAMIDOL (ISOVUE-300) INJECTION 61% COMPARISON:  None. FINDINGS: Lower chest: Lung bases are clear. No effusions. Heart is normal size. Hepatobiliary: No focal hepatic abnormality. Gallbladder unremarkable. Pancreas: No focal abnormality or ductal dilatation. Spleen: No focal abnormality.  Normal size. Adrenals/Urinary Tract: No adrenal abnormality. No focal renal abnormality. No stones or hydronephrosis. Urinary bladder is  unremarkable. Stomach/Bowel: Stomach, large and small bowel grossly unremarkable. The appendix is mildly prominent, measuring up to 8 mm. Slight haziness noted around the appendix. Findings are concerning for early acute appendicitis. Vascular/Lymphatic: No evidence of aneurysm or adenopathy. Reproductive: No visible focal abnormality. Other: No free fluid or free air. Musculoskeletal: No acute bony abnormality. IMPRESSION: Slight prominence of the appendix with slight surrounding haziness. Findings are concerning for early acute appendicitis. Electronically Signed   By: Charlett Nose M.D.   On: 07/26/2016 08:54   US Abdomen Limited Ruq  Result Date: 07/26/2016 CLINICAL DATA:  Right upper quadrant pain, nausea, vomiting EXAM: US ABDOMEN LIMITED - RIGHT UPPER QUADRANT COMPARISON:  None. FINDINGS: Gallbladder: No gallstones or wall thickening visualized. No sonographic Murphy sign noted by sonographer. Common bile duct: Diameter: Normal caliber, 3 mm Liver: No focal lesion identified. Within normal limits in parenchymal echogenicity. IMPRESSION: Normal study Electronically Signed   By: Charlett Nose M.D.   On: 07/26/2016 07:07    Review of Systems  Constitutional: Positive for chills. Negative for fever.  HENT: Negative for ear discharge, ear pain, hearing loss and tinnitus.   Eyes: Negative for blurred vision, double vision, photophobia and pain.  Respiratory: Negative for cough, sputum production and shortness of breath.   Cardiovascular: Negative for chest pain.  Gastrointestinal: Positive for abdominal pain, diarrhea, nausea and vomiting. Negative for blood in stool.  Genitourinary: Negative for dysuria, flank pain, frequency and urgency.  Musculoskeletal: Negative for back pain, falls, joint pain, myalgias and neck pain.  Neurological: Negative for dizziness, tingling, sensory change, focal weakness, loss of consciousness and headaches.  Endo/Heme/Allergies: Does not bruise/bleed easily.   Psychiatric/Behavioral: Negative for depression, memory loss and substance abuse. The patient is not nervous/anxious.     Blood pressure 106/74, pulse 82, temperature 99.3 F (37.4 C), temperature source Oral, resp. rate 18, height 5\' 4"  (1.626 m), weight 59 kg (130 lb), SpO2 97 %. Physical Exam  Constitutional: He appears well-developed and well-nourished. No distress.  HENT:  Head: Normocephalic and atraumatic.  Right Ear: External ear normal.  Left Ear: External ear normal.  Mouth/Throat: No oropharyngeal exudate.  Eyes: Conjunctivae are normal. Right eye exhibits no discharge. Left eye exhibits no discharge. No scleral icterus.  Cardiovascular: Normal rate, regular rhythm, normal heart sounds and intact distal pulses.  Exam reveals no gallop and no friction rub.   No murmur heard. Respiratory: Effort normal and breath sounds normal. No respiratory distress. He has no wheezes. He has no rales.  GI: Soft. Bowel sounds are decreased. There is tenderness. There is  tenderness at McBurney's point. There is no rebound and no guarding.  Musculoskeletal: Normal range of motion.  Lymphadenopathy:    He has no cervical adenopathy.  Neurological: He is alert.  Skin: Skin is warm and dry. He is not diaphoretic.  Psychiatric: He has a normal mood and affect. His behavior is normal.     Assessment/Plan Acute appendicitis -- Will plan for lap appy    Lisette Abu, PA-C Pager: [phone] 07/26/2016, 11:06 AM

## 2016-07-26 NOTE — Transfer of Care (Signed)
Immediate Anesthesia Transfer of Care Note  Patient: Harry Ortiz  Procedure(s) Performed: Procedure(s): APPENDECTOMY LAPAROSCOPIC (N/A)  Patient Location: PACU  Anesthesia Type:General  Level of Consciousness: awake, alert  and oriented  Airway & Oxygen Therapy: Patient Spontanous Breathing and Patient connected to nasal cannula oxygen  Post-op Assessment: Report given to RN, Post -op Vital signs reviewed and stable and Patient moving all extremities X 4  Post vital signs: Reviewed and stable  Last Vitals:  Vitals:   07/26/16 1100 07/26/16 1130  BP: 113/65 105/67  Pulse: 86 92  Resp:  16  Temp:      Last Pain:  Vitals:   07/26/16 0807  TempSrc: Oral  PainSc:          Complications: No apparent anesthesia complications

## 2016-07-26 NOTE — Op Note (Signed)
07/26/2016  1:03 PM  PATIENT:  Harry FragminJorge Ortiz  31 y.o. male  PRE-OPERATIVE DIAGNOSIS:  appendicitis  POST-OPERATIVE DIAGNOSIS:  appendicitis  PROCEDURE:  Procedure(s): APPENDECTOMY LAPAROSCOPIC  SURGEON:  Surgeon(s): Violeta GelinasBurke Tzirel Leonor, MD  ASSISTANTS: none   ANESTHESIA:   local and general  EBL:  Total I/O In: 1050 [I.V.:1050] Out: 602 [Urine:600; Blood:2]  BLOOD ADMINISTERED:none  DRAINS: none   SPECIMEN:  Excision  DISPOSITION OF SPECIMEN:  PATHOLOGY  COUNTS:  YES  DICTATION: .Dragon Dictation Findings: Suppurative appendicitis without perforation  Procedure in detail: Harry PoreJorge was identified in the preop holding area. Informed consent was obtained. He received intravenous and buttocks. He was brought to the operating room and general endotracheal anesthesia was administered by the anesthesia staff. His abdomen was prepped and draped in a sterile fashion. We did a time out procedure.The infraumbilical region was infiltrated with local. Infraumbilical incision was made. Subcutaneous tissues were dissected down revealing the anterior fascia. This was divided sharply along the midline. Peritoneal cavity was entered under direct vision without complication. A 0 Vicryl pursestring was placed around the fascial opening. Hassan trocar was inserted into the abdomen. The abdomen was insufflated with carbon dioxide in standard fashion. Under direct vision a 12 mm left lower quadrant and a 5 mm right mid port were placed. Local was used at each port site. Laparoscopic exploration revealed an acutely inflamed appendix without perforation. The omentum was mildly adherent to it. This was separated easily. The mesoappendix was divided with harmonic scalpel. The base of the appendix was divided with Endo GIA with a vascular load. The appendix was placed in an Endo Catch bag and removed from the abdomen. It was sent to pathology. The abdomen was copiously irrigated. The staple line was intact. There  was excellent hemostasis. Irrigation fluid was evacuated. Ports were removed under direct vision. Pneumoperitoneum was released. Infraumbilical fascia was closed by tying the pursestring. All 3 wounds were irrigated and the skin of each was closed with running 4-0 Vicryl subcuticular followed by Dermabond. All counts were correct. He tolerated the procedure well without apparent complications and was taken recovery in stable condition.  PATIENT DISPOSITION:  PACU - hemodynamically stable.   Delay start of Pharmacological VTE agent (>24hrs) due to surgical blood loss or risk of bleeding:  no  Violeta GelinasBurke John Williamsen, MD, MPH, FACS Pager: [phone]  1/28/20181:03 PM

## 2016-07-26 NOTE — ED Triage Notes (Signed)
Pt c/o 10/10 abd pain, nausea and vomiting that started 3 hours ago, denies any fever states he is feeling numbness on his arms, pt hyperventilating while in triage.

## 2016-07-26 NOTE — Anesthesia Procedure Notes (Signed)
Procedure Name: Intubation Date/Time: 07/26/2016 12:22 PM Performed by: Neldon Newport Pre-anesthesia Checklist: Timeout performed, Patient being monitored, Suction available, Emergency Drugs available and Patient identified Patient Re-evaluated:Patient Re-evaluated prior to inductionOxygen Delivery Method: Circle system utilized Preoxygenation: Pre-oxygenation with 100% oxygen Intubation Type: IV induction Ventilation: Mask ventilation without difficulty Laryngoscope Size: Mac and 3 Grade View: Grade I Tube type: Oral Number of attempts: 1 Placement Confirmation: breath sounds checked- equal and bilateral,  positive ETCO2 and ETT inserted through vocal cords under direct vision Secured at: 22 cm Tube secured with: Tape Dental Injury: Teeth and Oropharynx as per pre-operative assessment

## 2016-07-26 NOTE — Anesthesia Preprocedure Evaluation (Addendum)
Anesthesia Evaluation  Patient identified by MRN, date of birth, ID band Patient awake    Reviewed: Allergy & Precautions, H&P , NPO status , Patient's Chart, lab work & pertinent test results  Airway Mallampati: II  TM Distance: >3 FB Neck ROM: Full    Dental no notable dental hx. (+) Teeth Intact, Dental Advisory Given   Pulmonary neg pulmonary ROS,    Pulmonary exam normal breath sounds clear to auscultation       Cardiovascular negative cardio ROS   Rhythm:Regular Rate:Normal     Neuro/Psych negative neurological ROS  negative psych ROS   GI/Hepatic negative GI ROS, Neg liver ROS,   Endo/Other  negative endocrine ROS  Renal/GU negative Renal ROS  negative genitourinary   Musculoskeletal   Abdominal   Peds  Hematology negative hematology ROS (+)   Anesthesia Other Findings   Reproductive/Obstetrics negative OB ROS                            Anesthesia Physical Anesthesia Plan  ASA: I  Anesthesia Plan: General   Post-op Pain Management:    Induction: Intravenous, Rapid sequence and Cricoid pressure planned  Airway Management Planned: Oral ETT  Additional Equipment:   Intra-op Plan:   Post-operative Plan: Extubation in OR  Informed Consent: I have reviewed the patients History and Physical, chart, labs and discussed the procedure including the risks, benefits and alternatives for the proposed anesthesia with the patient or authorized representative who has indicated his/her understanding and acceptance.   Dental advisory given  Plan Discussed with: CRNA, Anesthesiologist and Surgeon  Anesthesia Plan Comments:        Anesthesia Quick Evaluation

## 2016-07-27 ENCOUNTER — Encounter (HOSPITAL_COMMUNITY): Payer: Self-pay | Admitting: General Surgery

## 2016-07-27 MED ORDER — IBUPROFEN 200 MG PO TABS: ORAL_TABLET | ORAL | Status: AC

## 2016-07-27 MED ORDER — IBUPROFEN 600 MG PO TABS: 600.0000 mg | ORAL_TABLET | Freq: Four times a day (QID) | ORAL | Status: DC | PRN

## 2016-07-27 MED ORDER — OXYCODONE HCL 5 MG PO TABS: 5.0000 mg | ORAL_TABLET | ORAL | 0 refills | Status: AC | PRN

## 2016-07-27 MED ORDER — ACETAMINOPHEN 325 MG PO TABS: 650.0000 mg | ORAL_TABLET | Freq: Four times a day (QID) | ORAL | Status: DC | PRN

## 2016-07-27 MED ORDER — ACETAMINOPHEN 325 MG PO TABS: ORAL_TABLET | ORAL | Status: AC

## 2016-07-27 NOTE — Discharge Instructions (Signed)
[facility] ______[facility] °LAPAROSCOPIC SURGERY: POST OP INSTRUCTIONS °Always review your discharge instruction sheet given to you by the facility where your surgery was performed. °IF YOU HAVE DISABILITY OR FAMILY LEAVE FORMS, YOU MUST BRING THEM TO THE OFFICE FOR PROCESSING.   °DO NOT GIVE THEM TO YOUR DOCTOR. ° °1. A prescription for pain medication may be given to you upon discharge.  Take your pain medication as prescribed, if needed.  If narcotic pain medicine is not needed, then you may take acetaminophen (Tylenol) or ibuprofen (Advil) as needed. °2. Take your usually prescribed medications unless otherwise directed. °3. If you need a refill on your pain medication, please contact your pharmacy.  They will contact our office to request authorization. Prescriptions will not be filled after 5pm or on week-ends. °4. You should follow a light diet the first few days after arrival home, such as soup and crackers, etc.  Be sure to include lots of fluids daily. °5. Most patients will experience some swelling and bruising in the area of the incisions.  Ice packs will help.  Swelling and bruising can take several days to resolve.  °6. It is common to experience some constipation if taking pain medication after surgery.  Increasing fluid intake and taking a stool softener (such as Colace) will usually help or prevent this problem from occurring.  A mild laxative (Milk of Magnesia or Miralax) should be taken according to package instructions if there are no bowel movements after 48 hours. °7. Unless discharge instructions indicate otherwise, you may remove your bandages 24-48 hours after surgery, and you may shower at that time.  You may have steri-strips (small skin tapes) in place directly over the incision.  These strips should be left on the skin for 7-10 days.  If your surgeon used skin glue on the incision, you may shower in 24 hours.  The glue will flake off over the next 2-3 weeks.  Any sutures or  staples will be removed at the office during your follow-up visit. °8. ACTIVITIES:  You may resume regular (light) daily activities beginning the next day--such as daily self-care, walking, climbing stairs--gradually increasing activities as tolerated.  You may have sexual intercourse when it is comfortable.  Refrain from any heavy lifting or straining until approved by your doctor. °a. You may drive when you are no longer taking prescription pain medication, you can comfortably wear a seatbelt, and you can safely maneuver your car and apply brakes. °b. RETURN TO WORK:  __________________________________________________________ °9. You should see your doctor in the office for a follow-up appointment approximately 2-3 weeks after your surgery.  Make sure that you call for this appointment within a day or two after you arrive home to insure a convenient appointment time. °10. OTHER INSTRUCTIONS: __________________________________________________________________________________________________________________________ __________________________________________________________________________________________________________________________ °WHEN TO CALL YOUR DOCTOR: °1. Fever over 101.0 °2. Inability to urinate °3. Continued bleeding from incision. °4. Increased pain, redness, or drainage from the incision. °5. Increasing abdominal pain ° °The clinic staff is available to answer your questions during regular business hours.  Please don’t hesitate to call and ask to speak to one of the nurses for clinical concerns.  If you have a medical emergency, go to the nearest emergency room or call 911.  A surgeon from [facility] is always on call at the hospital. °[address], [address], [place], [place]  [address] ? [address] [address], [place], [place]   [address] °[phone] ? [phone] ? FAX [phone] °Web site:   [email or web address] ° ° °Laparoscopic Appendectomy, Adult, Care After °Refer to  this sheet in the next few weeks. These instructions provide you with information about caring for yourself after your procedure. Your health care provider may also give you more specific instructions. Your treatment has been planned according to current medical practices, but problems sometimes occur. Call your health care provider if you have any problems or questions after your procedure. °What can I expect after the procedure? °After the procedure, it is common to have: °· A decrease in your energy level. °· Mild pain in the area where the surgical cuts (incisions) were made. °· Constipation. This can be caused by pain medicine and a decrease in your activity. °Follow these instructions at home: °Medicines  °· Take over-the-counter and prescription medicines only as told by your health care provider. °· Do not drive for 24 hours if you received a sedative. °· Do not drive or operate heavy machinery while taking prescription pain medicine. °· If you were prescribed an antibiotic medicine, take it as told by your health care provider. Do not stop taking the antibiotic even if you start to feel better. °Activity  °· For 3 weeks or as long as told by your health care provider: °¨ Do not lift anything that is heavier than 10 pounds (4.5 kg). °¨ Do not play contact sports. °· Gradually return to your normal activities. Ask your health care provider what activities are safe for you. °Bathing  °· Keep your incisions clean and dry. Clean them as often as told by your health care provider: °¨ Gently wash the incisions with soap and water. °¨ Rinse the incisions with water to remove all soap. °¨ Pat the incisions dry with a clean towel. Do not rub the incisions. °· You may take showers after 48 hours. °· Do not take baths, swim, or use hot tubs for 2 weeks or as told by your health care provider. °Incision care  °· Follow instructions from your healthcare provider about how to take care of your incisions. Make sure  you: °¨ Wash your hands with soap and water before you change your bandage (dressing). If soap and water are not available, use hand sanitizer. °¨ Change your dressing as told by your health care provider. °¨ Leave stitches (sutures), skin glue, or adhesive strips in place. These skin closures may need to stay in place for 2 weeks or longer. If adhesive strip edges start to loosen and curl up, you may trim the loose edges. Do not remove adhesive strips completely unless your health care provider tells you to do that. °· Check your incision areas every day for signs of infection. Check for: °¨ More redness, swelling, or pain. °¨ More fluid or blood. °¨ Warmth. °¨ Pus or a bad smell. °Other Instructions  °· If you were sent home with a drain, follow instructions from your health care provider about how to care for the drain and how to empty it. °· Take deep breaths. This helps to prevent your lungs from becoming inflamed. °· To relieve and prevent constipation: °¨ Drink plenty of fluids. °¨ Eat plenty of fruits and vegetables. °· Keep all follow-up visits as told by your health care provider. This is important. °Contact a health care provider if: °· You have more redness, swelling, or pain around an incision. °· You have more fluid or blood coming from an incision. °· Your incision feels warm to the touch. °· You have pus or a bad smell coming   from an incision or dressing. °· Your incision edges break open after your sutures have been removed. °· You have increasing pain in your shoulders. °· You feel dizzy or you faint. °· You develop shortness of breath. °· You keep feeling nauseous or vomiting. °· You have diarrhea or you cannot control your bowel functions. °· You lose your appetite. °· You develop swelling or pain in your legs. °Get help right away if: °· You have a fever. °· You develop a rash. °· You have difficulty breathing. °· You have sharp pains in your chest. °This information is not intended to replace  advice given to you by your health care provider. Make sure you discuss any questions you have with your health care provider. °Document Released: 06/15/2005 Document Revised: 11/15/2015 Document Reviewed: 12/03/2014 °Elsevier Interactive Patient Education © 2017 [employer] Inc[employer] ° °

## 2016-07-27 NOTE — Progress Notes (Signed)
1 Day Post-Op  Subjective: He looks fine but continues to have some discomfort. He does not want to get up and move around. He has a regular diet, mostly vegetables and it looks like he tolerated that well.  Objective: Vital signs in last 24 hours: Temp:  [97.7 F (36.5 C)-98.6 F (37 C)] 98.5 F (36.9 C) (01/29 0649) Pulse Rate:  [73-79] 73 (01/29 0649) Resp:  [16-19] 17 (01/29 0649) BP: (93-114)/(55-68) 93/60 (01/29 0649) SpO2:  [97 %-99 %] 98 % (01/29 0649)   720 PO 1700 IV Urine 2375 Afebrile vital signs are stable No labs Intake/Output from previous day: 01/28 0701 - 01/29 0700 In: 2436.7 [P.O.:720; I.V.:1716.7] Out: 2377 [Urine:2375; Blood:2] Intake/Output this shift: No intake/output data recorded.  General appearance: alert, cooperative, no distress and Anxious, and complaining of abdominal pain. GI: Soft, very tender he doesn't want me to touch him. Port sites all look good.  Lab Results:   Recent Labs  07/26/16 0400  WBC 22.2*  HGB 15.8  HCT 44.6  PLT 275    BMET  Recent Labs  07/26/16 0400  NA 137  K 3.6  CL 104  CO2 22  GLUCOSE 127*  BUN 18  CREATININE 0.81  CALCIUM 9.8   PT/INR No results for input(s): LABPROT, INR in the last 72 hours.   Recent Labs Lab 07/26/16 0400  AST 32  ALT 47  ALKPHOS 68  BILITOT 0.5  PROT 8.1  ALBUMIN 4.7     Lipase     Component Value Date/Time   LIPASE 31 07/26/2016 0400     Studies/Results: Ct Abdomen Pelvis W Contrast  Result Date: 07/26/2016 CLINICAL DATA:  Abdominal pain all over, vomiting, chills EXAM: CT ABDOMEN AND PELVIS WITH CONTRAST TECHNIQUE: Multidetector CT imaging of the abdomen and pelvis was performed using the standard protocol following bolus administration of intravenous contrast. CONTRAST:  100mL ISOVUE-300 IOPAMIDOL (ISOVUE-300) INJECTION 61% COMPARISON:  None. FINDINGS: Lower chest: Lung bases are clear. No effusions. Heart is normal size. Hepatobiliary: No focal hepatic  abnormality. Gallbladder unremarkable. Pancreas: No focal abnormality or ductal dilatation. Spleen: No focal abnormality.  Normal size. Adrenals/Urinary Tract: No adrenal abnormality. No focal renal abnormality. No stones or hydronephrosis. Urinary bladder is unremarkable. Stomach/Bowel: Stomach, large and small bowel grossly unremarkable. The appendix is mildly prominent, measuring up to 8 mm. Slight haziness noted around the appendix. Findings are concerning for early acute appendicitis. Vascular/Lymphatic: No evidence of aneurysm or adenopathy. Reproductive: No visible focal abnormality. Other: No free fluid or free air. Musculoskeletal: No acute bony abnormality. IMPRESSION: Slight prominence of the appendix with slight surrounding haziness. Findings are concerning for early acute appendicitis. Electronically Signed   By: Charlett NoseKevin  Dover M.D.   On: 07/26/2016 08:54   [place] Abdomen Limited Ruq  Result Date: 07/26/2016 CLINICAL DATA:  Right upper quadrant pain, nausea, vomiting EXAM: US ABDOMEN LIMITED - RIGHT UPPER QUADRANT COMPARISON:  None. FINDINGS: Gallbladder: No gallstones or wall thickening visualized. No sonographic Murphy sign noted by sonographer. Common bile duct: Diameter: Normal caliber, 3 mm Liver: No focal lesion identified. Within normal limits in parenchymal echogenicity. IMPRESSION: Normal study Electronically Signed   By: Charlett NoseKevin  Dover M.D.   On: 07/26/2016 07:07   Prior to Admission medications   Medication Sig Start Date End Date Taking? Authorizing Provider  loratadine (CLARITIN) 10 MG tablet Take 10 mg by mouth daily.   Yes Historical Provider, MD  penicillin v potassium (VEETID) 250 MG tablet Take 250 mg by mouth  daily.   Yes Historical Provider, MD    Medications: . docusate sodium  100 mg Oral BID  . enoxaparin (LOVENOX) injection  40 mg Subcutaneous Q24H  . loratadine  10 mg Oral QHS   . 0.45 % NaCl with KCl 20 mEq / L 100 mL/hr at 07/27/16 1330    Assessment/Plan Acute  appendicitis Status post laparoscopic appendectomy, 07/26/16 Dr. Violeta Gelinas FEN: IV fluids/regular diet ID: Preop Zosyn DVT: Lovenox    Plan: Mobilize, see how he does on oral pain meds and possibly home later today.     LOS: 0 days    Ector Laurel 07/27/2016 [id number]

## 2016-07-27 NOTE — Progress Notes (Signed)
Discharge instructions reviewed with pt and prescription given.  Pt verbalized understanding and questions answered.  Pt discharged in stable condition with roommate.  Hector ShadeMoss, Jalon Blackwelder WanetteLindsay

## 2016-07-27 NOTE — Discharge Summary (Signed)
Physician Discharge Summary  Patient ID: Harry Ortiz MRN: [id number] DOB/AGE: July 20, 1985 31 y.o.  Admit date: 07/26/2016 Discharge date: 07/27/2016  Admission Diagnoses:  Acute appendicitis  Discharge Diagnoses:  Acute appendicitis    Active Problems:   Acute appendicitis   PROCEDURES: Laparoscopic appendectomy, 07/26/16 Dr. Gloris HamBurke Thompson  Hospital Course:  Harry Ortiz had actue onset periumbilical pain beginning last night around midnight. It was severe and associated with N/V and a little diarrhea. He also had chills. No prior e/o. He sought care in the ED and an abd CT showed appendicitis. His pain abated around 6 this am. She was admitted taken the operating room later that day by Dr. Violeta GelinasBurke Thompson. Tolerated the procedure well. He was mobilized, his diet was advanced and by the evening of 07/27/16, he was ready for discharge.  CBC Latest Ref Rng & Units 07/26/2016  WBC 4.0 - 10.5 K/uL 22.2(H)  Hemoglobin 13.0 - 17.0 g/dL 40.915.8  Hematocrit 81.139.0 - 52.0 % 44.6  Platelets 150 - 400 K/uL 275    CMP Latest Ref Rng & Units 07/26/2016  Glucose 65 - 99 mg/dL 914(N127(H)  BUN 6 - 20 mg/dL 18  Creatinine 8.290.61 - 5.621.24 mg/dL 1.300.81  Sodium 865135 - 784145 mmol/L 137  Potassium 3.5 - 5.1 mmol/L 3.6  Chloride 101 - 111 mmol/L 104  CO2 22 - 32 mmol/L 22  Calcium 8.9 - 10.3 mg/dL 9.8  Total Protein 6.5 - 8.1 g/dL 8.1  Total Bilirubin 0.3 - 1.2 mg/dL 0.5  Alkaline Phos 38 - 126 U/L 68  AST 15 - 41 U/L 32  ALT 17 - 63 U/L 47    Condition ON discharge: Improving     Disposition: Final discharge disposition not confirmed   Allergies as of 07/27/2016   No Known Allergies     Medication List    TAKE these medications   acetaminophen 325 MG tablet Commonly known as:  TYLENOL You can take plain Tylenol or ibuprofen measure first line pain medication. You can take 2 tablets every 4 hours of the plain Tylenol. Do not take more than 4000 mg of Tylenol per day.   ibuprofen 200 MG tablet Commonly  known as:  ADVIL,MOTRIN You can take 2-3 tablets every 6 hours as needed for pain. You can use this as your first line drug. Use prescription medicine as second line drug.   loratadine 10 MG tablet Commonly known as:  CLARITIN Take 10 mg by mouth daily.   oxyCODONE 5 MG immediate release tablet Commonly known as:  Oxy IR/ROXICODONE Take 1-2 tablets (5-10 mg total) by mouth every 4 (four) hours as needed for moderate pain or severe pain.   penicillin v potassium 250 MG tablet Commonly known as:  VEETID Take 250 mg by mouth daily.      Follow-up Information    [facility] Follow up.   Specialty:  General Surgery Why:  Follow-up in 08/18/16 with our clinic. Your appointment is at 10 AM.  Be at the office 30 minutes early for check-in. Bring picture ID and insurance information. Contact information: [address] [place] [address] [phone]           Signed: Sherrie GeorgeJENNINGS,Aliah Eriksson 07/27/2016, 5:08 PM

## 2016-07-28 LAB — CULTURE, GROUP A STREP (THRC)

## 2016-08-06 DIAGNOSIS — M62838 Other muscle spasm: Secondary | ICD-10-CM | POA: Diagnosis not present

## 2016-08-06 DIAGNOSIS — M9903 Segmental and somatic dysfunction of lumbar region: Secondary | ICD-10-CM | POA: Diagnosis not present

## 2016-08-06 DIAGNOSIS — M5408 Panniculitis affecting regions of neck and back, sacral and sacrococcygeal region: Secondary | ICD-10-CM | POA: Diagnosis not present

## 2016-08-06 DIAGNOSIS — M9902 Segmental and somatic dysfunction of thoracic region: Secondary | ICD-10-CM | POA: Diagnosis not present

## 2016-08-07 DIAGNOSIS — M9902 Segmental and somatic dysfunction of thoracic region: Secondary | ICD-10-CM | POA: Diagnosis not present

## 2016-08-07 DIAGNOSIS — M9903 Segmental and somatic dysfunction of lumbar region: Secondary | ICD-10-CM | POA: Diagnosis not present

## 2016-08-07 DIAGNOSIS — M62838 Other muscle spasm: Secondary | ICD-10-CM | POA: Diagnosis not present

## 2016-08-07 DIAGNOSIS — M5408 Panniculitis affecting regions of neck and back, sacral and sacrococcygeal region: Secondary | ICD-10-CM | POA: Diagnosis not present

## 2016-08-11 DIAGNOSIS — M5408 Panniculitis affecting regions of neck and back, sacral and sacrococcygeal region: Secondary | ICD-10-CM | POA: Diagnosis not present

## 2016-08-11 DIAGNOSIS — M9903 Segmental and somatic dysfunction of lumbar region: Secondary | ICD-10-CM | POA: Diagnosis not present

## 2016-08-11 DIAGNOSIS — M9902 Segmental and somatic dysfunction of thoracic region: Secondary | ICD-10-CM | POA: Diagnosis not present

## 2016-08-11 DIAGNOSIS — M62838 Other muscle spasm: Secondary | ICD-10-CM | POA: Diagnosis not present

## 2016-08-19 DIAGNOSIS — M9902 Segmental and somatic dysfunction of thoracic region: Secondary | ICD-10-CM | POA: Diagnosis not present

## 2016-08-19 DIAGNOSIS — M9903 Segmental and somatic dysfunction of lumbar region: Secondary | ICD-10-CM | POA: Diagnosis not present

## 2016-08-19 DIAGNOSIS — M5408 Panniculitis affecting regions of neck and back, sacral and sacrococcygeal region: Secondary | ICD-10-CM | POA: Diagnosis not present

## 2016-08-19 DIAGNOSIS — M62838 Other muscle spasm: Secondary | ICD-10-CM | POA: Diagnosis not present

## 2016-09-10 DIAGNOSIS — F419 Anxiety disorder, unspecified: Secondary | ICD-10-CM | POA: Diagnosis not present

## 2016-09-10 DIAGNOSIS — F329 Major depressive disorder, single episode, unspecified: Secondary | ICD-10-CM | POA: Diagnosis not present

## 2016-09-15 DIAGNOSIS — Z578 Occupational exposure to other risk factors: Secondary | ICD-10-CM | POA: Diagnosis not present

## 2016-09-15 DIAGNOSIS — B009 Herpesviral infection, unspecified: Secondary | ICD-10-CM | POA: Diagnosis not present

## 2016-09-15 DIAGNOSIS — Z6824 Body mass index (BMI) 24.0-24.9, adult: Secondary | ICD-10-CM | POA: Diagnosis not present

## 2016-09-15 DIAGNOSIS — Z206 Contact with and (suspected) exposure to human immunodeficiency virus [HIV]: Secondary | ICD-10-CM | POA: Diagnosis not present

## 2016-09-16 DIAGNOSIS — F329 Major depressive disorder, single episode, unspecified: Secondary | ICD-10-CM | POA: Diagnosis not present

## 2016-09-16 DIAGNOSIS — F419 Anxiety disorder, unspecified: Secondary | ICD-10-CM | POA: Diagnosis not present

## 2016-10-02 DIAGNOSIS — F419 Anxiety disorder, unspecified: Secondary | ICD-10-CM | POA: Diagnosis not present

## 2016-10-02 DIAGNOSIS — F329 Major depressive disorder, single episode, unspecified: Secondary | ICD-10-CM | POA: Diagnosis not present

## 2016-10-13 DIAGNOSIS — Z13220 Encounter for screening for lipoid disorders: Secondary | ICD-10-CM | POA: Diagnosis not present

## 2016-10-13 DIAGNOSIS — Z6824 Body mass index (BMI) 24.0-24.9, adult: Secondary | ICD-10-CM | POA: Diagnosis not present

## 2016-10-14 DIAGNOSIS — F329 Major depressive disorder, single episode, unspecified: Secondary | ICD-10-CM | POA: Diagnosis not present

## 2016-10-14 DIAGNOSIS — F419 Anxiety disorder, unspecified: Secondary | ICD-10-CM | POA: Diagnosis not present

## 2016-10-21 DIAGNOSIS — F419 Anxiety disorder, unspecified: Secondary | ICD-10-CM | POA: Diagnosis not present

## 2016-10-21 DIAGNOSIS — F329 Major depressive disorder, single episode, unspecified: Secondary | ICD-10-CM | POA: Diagnosis not present

## 2016-10-23 DIAGNOSIS — F329 Major depressive disorder, single episode, unspecified: Secondary | ICD-10-CM | POA: Diagnosis not present

## 2016-10-23 DIAGNOSIS — F419 Anxiety disorder, unspecified: Secondary | ICD-10-CM | POA: Diagnosis not present

## 2016-10-28 DIAGNOSIS — F419 Anxiety disorder, unspecified: Secondary | ICD-10-CM | POA: Diagnosis not present

## 2016-10-28 DIAGNOSIS — F329 Major depressive disorder, single episode, unspecified: Secondary | ICD-10-CM | POA: Diagnosis not present

## 2016-11-04 DIAGNOSIS — F419 Anxiety disorder, unspecified: Secondary | ICD-10-CM | POA: Diagnosis not present

## 2016-11-04 DIAGNOSIS — F329 Major depressive disorder, single episode, unspecified: Secondary | ICD-10-CM | POA: Diagnosis not present

## 2016-11-11 DIAGNOSIS — F329 Major depressive disorder, single episode, unspecified: Secondary | ICD-10-CM | POA: Diagnosis not present

## 2016-11-11 DIAGNOSIS — F419 Anxiety disorder, unspecified: Secondary | ICD-10-CM | POA: Diagnosis not present

## 2016-11-29 DIAGNOSIS — F329 Major depressive disorder, single episode, unspecified: Secondary | ICD-10-CM | POA: Diagnosis not present

## 2016-11-29 DIAGNOSIS — F419 Anxiety disorder, unspecified: Secondary | ICD-10-CM | POA: Diagnosis not present

## 2016-12-09 DIAGNOSIS — F329 Major depressive disorder, single episode, unspecified: Secondary | ICD-10-CM | POA: Diagnosis not present

## 2016-12-09 DIAGNOSIS — F419 Anxiety disorder, unspecified: Secondary | ICD-10-CM | POA: Diagnosis not present

## 2016-12-17 DIAGNOSIS — F329 Major depressive disorder, single episode, unspecified: Secondary | ICD-10-CM | POA: Diagnosis not present

## 2016-12-17 DIAGNOSIS — F419 Anxiety disorder, unspecified: Secondary | ICD-10-CM | POA: Diagnosis not present

## 2016-12-23 DIAGNOSIS — F329 Major depressive disorder, single episode, unspecified: Secondary | ICD-10-CM | POA: Diagnosis not present

## 2016-12-23 DIAGNOSIS — F419 Anxiety disorder, unspecified: Secondary | ICD-10-CM | POA: Diagnosis not present

## 2016-12-29 DIAGNOSIS — F419 Anxiety disorder, unspecified: Secondary | ICD-10-CM | POA: Diagnosis not present

## 2016-12-29 DIAGNOSIS — F329 Major depressive disorder, single episode, unspecified: Secondary | ICD-10-CM | POA: Diagnosis not present

## 2017-01-06 DIAGNOSIS — F419 Anxiety disorder, unspecified: Secondary | ICD-10-CM | POA: Diagnosis not present

## 2017-01-06 DIAGNOSIS — F329 Major depressive disorder, single episode, unspecified: Secondary | ICD-10-CM | POA: Diagnosis not present

## 2017-01-08 DIAGNOSIS — R21 Rash and other nonspecific skin eruption: Secondary | ICD-10-CM | POA: Diagnosis not present

## 2017-01-08 DIAGNOSIS — L731 Pseudofolliculitis barbae: Secondary | ICD-10-CM | POA: Diagnosis not present

## 2017-01-08 DIAGNOSIS — B009 Herpesviral infection, unspecified: Secondary | ICD-10-CM | POA: Diagnosis not present

## 2017-01-08 DIAGNOSIS — Z206 Contact with and (suspected) exposure to human immunodeficiency virus [HIV]: Secondary | ICD-10-CM | POA: Diagnosis not present

## 2017-01-18 DIAGNOSIS — F4320 Adjustment disorder, unspecified: Secondary | ICD-10-CM | POA: Diagnosis not present

## 2017-02-04 DIAGNOSIS — F4320 Adjustment disorder, unspecified: Secondary | ICD-10-CM | POA: Diagnosis not present

## 2017-02-25 DIAGNOSIS — F4320 Adjustment disorder, unspecified: Secondary | ICD-10-CM | POA: Diagnosis not present

## 2017-03-30 DIAGNOSIS — M9902 Segmental and somatic dysfunction of thoracic region: Secondary | ICD-10-CM | POA: Diagnosis not present

## 2017-03-30 DIAGNOSIS — M2560 Stiffness of unspecified joint, not elsewhere classified: Secondary | ICD-10-CM | POA: Diagnosis not present

## 2017-03-30 DIAGNOSIS — M9901 Segmental and somatic dysfunction of cervical region: Secondary | ICD-10-CM | POA: Diagnosis not present

## 2017-03-30 DIAGNOSIS — M62838 Other muscle spasm: Secondary | ICD-10-CM | POA: Diagnosis not present

## 2017-04-06 DIAGNOSIS — Z23 Encounter for immunization: Secondary | ICD-10-CM | POA: Diagnosis not present

## 2017-04-06 DIAGNOSIS — M62838 Other muscle spasm: Secondary | ICD-10-CM | POA: Diagnosis not present

## 2017-04-06 DIAGNOSIS — M9901 Segmental and somatic dysfunction of cervical region: Secondary | ICD-10-CM | POA: Diagnosis not present

## 2017-04-06 DIAGNOSIS — M9902 Segmental and somatic dysfunction of thoracic region: Secondary | ICD-10-CM | POA: Diagnosis not present

## 2017-04-06 DIAGNOSIS — M2560 Stiffness of unspecified joint, not elsewhere classified: Secondary | ICD-10-CM | POA: Diagnosis not present

## 2017-04-11 DIAGNOSIS — F419 Anxiety disorder, unspecified: Secondary | ICD-10-CM | POA: Diagnosis not present

## 2017-04-11 DIAGNOSIS — F329 Major depressive disorder, single episode, unspecified: Secondary | ICD-10-CM | POA: Diagnosis not present

## 2017-04-13 DIAGNOSIS — M2560 Stiffness of unspecified joint, not elsewhere classified: Secondary | ICD-10-CM | POA: Diagnosis not present

## 2017-04-13 DIAGNOSIS — M9902 Segmental and somatic dysfunction of thoracic region: Secondary | ICD-10-CM | POA: Diagnosis not present

## 2017-04-13 DIAGNOSIS — M9901 Segmental and somatic dysfunction of cervical region: Secondary | ICD-10-CM | POA: Diagnosis not present

## 2017-04-13 DIAGNOSIS — M62838 Other muscle spasm: Secondary | ICD-10-CM | POA: Diagnosis not present

## 2017-04-26 DIAGNOSIS — R21 Rash and other nonspecific skin eruption: Secondary | ICD-10-CM | POA: Diagnosis not present

## 2017-04-26 DIAGNOSIS — Z6824 Body mass index (BMI) 24.0-24.9, adult: Secondary | ICD-10-CM | POA: Diagnosis not present

## 2017-04-30 DIAGNOSIS — Z6824 Body mass index (BMI) 24.0-24.9, adult: Secondary | ICD-10-CM | POA: Diagnosis not present

## 2017-04-30 DIAGNOSIS — J029 Acute pharyngitis, unspecified: Secondary | ICD-10-CM | POA: Diagnosis not present

## 2017-04-30 DIAGNOSIS — J069 Acute upper respiratory infection, unspecified: Secondary | ICD-10-CM | POA: Diagnosis not present

## 2017-04-30 DIAGNOSIS — R21 Rash and other nonspecific skin eruption: Secondary | ICD-10-CM | POA: Diagnosis not present

## 2017-05-03 DIAGNOSIS — Z0000 Encounter for general adult medical examination without abnormal findings: Secondary | ICD-10-CM | POA: Diagnosis not present

## 2017-05-03 DIAGNOSIS — R21 Rash and other nonspecific skin eruption: Secondary | ICD-10-CM | POA: Diagnosis not present

## 2017-05-03 DIAGNOSIS — Z23 Encounter for immunization: Secondary | ICD-10-CM | POA: Diagnosis not present

## 2017-05-03 DIAGNOSIS — Z114 Encounter for screening for human immunodeficiency virus [HIV]: Secondary | ICD-10-CM | POA: Diagnosis not present

## 2017-05-03 DIAGNOSIS — Z1329 Encounter for screening for other suspected endocrine disorder: Secondary | ICD-10-CM | POA: Diagnosis not present

## 2017-05-06 DIAGNOSIS — Z0000 Encounter for general adult medical examination without abnormal findings: Secondary | ICD-10-CM | POA: Diagnosis not present

## 2017-05-06 DIAGNOSIS — F419 Anxiety disorder, unspecified: Secondary | ICD-10-CM | POA: Diagnosis not present

## 2017-05-06 DIAGNOSIS — Z6824 Body mass index (BMI) 24.0-24.9, adult: Secondary | ICD-10-CM | POA: Diagnosis not present

## 2017-05-06 DIAGNOSIS — F329 Major depressive disorder, single episode, unspecified: Secondary | ICD-10-CM | POA: Diagnosis not present

## 2017-06-15 DIAGNOSIS — Z23 Encounter for immunization: Secondary | ICD-10-CM | POA: Diagnosis not present

## 2017-08-19 DIAGNOSIS — M9901 Segmental and somatic dysfunction of cervical region: Secondary | ICD-10-CM | POA: Diagnosis not present

## 2017-08-19 DIAGNOSIS — M9903 Segmental and somatic dysfunction of lumbar region: Secondary | ICD-10-CM | POA: Diagnosis not present

## 2017-08-19 DIAGNOSIS — M9902 Segmental and somatic dysfunction of thoracic region: Secondary | ICD-10-CM | POA: Diagnosis not present

## 2017-08-19 DIAGNOSIS — M5408 Panniculitis affecting regions of neck and back, sacral and sacrococcygeal region: Secondary | ICD-10-CM | POA: Diagnosis not present

## 2017-08-31 DIAGNOSIS — M9902 Segmental and somatic dysfunction of thoracic region: Secondary | ICD-10-CM | POA: Diagnosis not present

## 2017-08-31 DIAGNOSIS — M9903 Segmental and somatic dysfunction of lumbar region: Secondary | ICD-10-CM | POA: Diagnosis not present

## 2017-08-31 DIAGNOSIS — M5408 Panniculitis affecting regions of neck and back, sacral and sacrococcygeal region: Secondary | ICD-10-CM | POA: Diagnosis not present

## 2017-08-31 DIAGNOSIS — M9901 Segmental and somatic dysfunction of cervical region: Secondary | ICD-10-CM | POA: Diagnosis not present

## 2017-09-09 DIAGNOSIS — M9903 Segmental and somatic dysfunction of lumbar region: Secondary | ICD-10-CM | POA: Diagnosis not present

## 2017-09-09 DIAGNOSIS — M5408 Panniculitis affecting regions of neck and back, sacral and sacrococcygeal region: Secondary | ICD-10-CM | POA: Diagnosis not present

## 2017-09-09 DIAGNOSIS — M9902 Segmental and somatic dysfunction of thoracic region: Secondary | ICD-10-CM | POA: Diagnosis not present

## 2017-09-09 DIAGNOSIS — M9901 Segmental and somatic dysfunction of cervical region: Secondary | ICD-10-CM | POA: Diagnosis not present

## 2017-10-22 ENCOUNTER — Other Ambulatory Visit: Payer: Self-pay | Admitting: Family Medicine

## 2017-10-22 DIAGNOSIS — R229 Localized swelling, mass and lump, unspecified: Principal | ICD-10-CM

## 2017-10-22 DIAGNOSIS — Z6823 Body mass index (BMI) 23.0-23.9, adult: Secondary | ICD-10-CM | POA: Diagnosis not present

## 2017-10-22 DIAGNOSIS — IMO0002 Reserved for concepts with insufficient information to code with codable children: Secondary | ICD-10-CM

## 2017-10-22 DIAGNOSIS — N5089 Other specified disorders of the male genital organs: Secondary | ICD-10-CM | POA: Diagnosis not present

## 2017-10-22 DIAGNOSIS — L0100 Impetigo, unspecified: Secondary | ICD-10-CM | POA: Diagnosis not present

## 2017-10-26 ENCOUNTER — Ambulatory Visit
Admission: RE | Admit: 2017-10-26 | Discharge: 2017-10-26 | Disposition: A | Discharge status: Home / Self Care | Payer: BLUE CROSS/BLUE SHIELD | Source: Ambulatory Visit | Attending: Family Medicine | Admitting: Family Medicine

## 2017-10-26 DIAGNOSIS — N503 Cyst of epididymis: Secondary | ICD-10-CM | POA: Diagnosis not present

## 2017-10-26 DIAGNOSIS — R229 Localized swelling, mass and lump, unspecified: Principal | ICD-10-CM

## 2017-10-26 DIAGNOSIS — IMO0002 Reserved for concepts with insufficient information to code with codable children: Secondary | ICD-10-CM

## 2017-11-02 DIAGNOSIS — Z23 Encounter for immunization: Secondary | ICD-10-CM | POA: Diagnosis not present

## 2018-03-21 DIAGNOSIS — Z23 Encounter for immunization: Secondary | ICD-10-CM | POA: Diagnosis not present

## 2018-04-14 DIAGNOSIS — M2560 Stiffness of unspecified joint, not elsewhere classified: Secondary | ICD-10-CM | POA: Diagnosis not present

## 2018-04-14 DIAGNOSIS — M62838 Other muscle spasm: Secondary | ICD-10-CM | POA: Diagnosis not present

## 2018-04-14 DIAGNOSIS — M9901 Segmental and somatic dysfunction of cervical region: Secondary | ICD-10-CM | POA: Diagnosis not present

## 2018-04-14 DIAGNOSIS — M9906 Segmental and somatic dysfunction of lower extremity: Secondary | ICD-10-CM | POA: Diagnosis not present

## 2018-04-21 DIAGNOSIS — M2560 Stiffness of unspecified joint, not elsewhere classified: Secondary | ICD-10-CM | POA: Diagnosis not present

## 2018-04-21 DIAGNOSIS — M62838 Other muscle spasm: Secondary | ICD-10-CM | POA: Diagnosis not present

## 2018-04-21 DIAGNOSIS — M9901 Segmental and somatic dysfunction of cervical region: Secondary | ICD-10-CM | POA: Diagnosis not present

## 2018-04-21 DIAGNOSIS — M9906 Segmental and somatic dysfunction of lower extremity: Secondary | ICD-10-CM | POA: Diagnosis not present

## 2018-04-25 DIAGNOSIS — M9901 Segmental and somatic dysfunction of cervical region: Secondary | ICD-10-CM | POA: Diagnosis not present

## 2018-04-25 DIAGNOSIS — M9906 Segmental and somatic dysfunction of lower extremity: Secondary | ICD-10-CM | POA: Diagnosis not present

## 2018-04-25 DIAGNOSIS — M62838 Other muscle spasm: Secondary | ICD-10-CM | POA: Diagnosis not present

## 2018-04-25 DIAGNOSIS — M2560 Stiffness of unspecified joint, not elsewhere classified: Secondary | ICD-10-CM | POA: Diagnosis not present

## 2018-05-05 DIAGNOSIS — M62838 Other muscle spasm: Secondary | ICD-10-CM | POA: Diagnosis not present

## 2018-05-05 DIAGNOSIS — M2560 Stiffness of unspecified joint, not elsewhere classified: Secondary | ICD-10-CM | POA: Diagnosis not present

## 2018-05-05 DIAGNOSIS — M9906 Segmental and somatic dysfunction of lower extremity: Secondary | ICD-10-CM | POA: Diagnosis not present

## 2018-05-05 DIAGNOSIS — M9901 Segmental and somatic dysfunction of cervical region: Secondary | ICD-10-CM | POA: Diagnosis not present

## 2018-05-09 DIAGNOSIS — H04123 Dry eye syndrome of bilateral lacrimal glands: Secondary | ICD-10-CM | POA: Diagnosis not present

## 2018-05-12 DIAGNOSIS — M2560 Stiffness of unspecified joint, not elsewhere classified: Secondary | ICD-10-CM | POA: Diagnosis not present

## 2018-05-12 DIAGNOSIS — M62838 Other muscle spasm: Secondary | ICD-10-CM | POA: Diagnosis not present

## 2018-05-12 DIAGNOSIS — M9901 Segmental and somatic dysfunction of cervical region: Secondary | ICD-10-CM | POA: Diagnosis not present

## 2018-05-12 DIAGNOSIS — M9906 Segmental and somatic dysfunction of lower extremity: Secondary | ICD-10-CM | POA: Diagnosis not present

## 2018-05-18 DIAGNOSIS — M9906 Segmental and somatic dysfunction of lower extremity: Secondary | ICD-10-CM | POA: Diagnosis not present

## 2018-05-18 DIAGNOSIS — M9901 Segmental and somatic dysfunction of cervical region: Secondary | ICD-10-CM | POA: Diagnosis not present

## 2018-05-18 DIAGNOSIS — M2560 Stiffness of unspecified joint, not elsewhere classified: Secondary | ICD-10-CM | POA: Diagnosis not present

## 2018-05-18 DIAGNOSIS — M62838 Other muscle spasm: Secondary | ICD-10-CM | POA: Diagnosis not present

## 2018-06-02 DIAGNOSIS — M2560 Stiffness of unspecified joint, not elsewhere classified: Secondary | ICD-10-CM | POA: Diagnosis not present

## 2018-06-02 DIAGNOSIS — M62838 Other muscle spasm: Secondary | ICD-10-CM | POA: Diagnosis not present

## 2018-06-02 DIAGNOSIS — M9901 Segmental and somatic dysfunction of cervical region: Secondary | ICD-10-CM | POA: Diagnosis not present

## 2018-06-02 DIAGNOSIS — M9903 Segmental and somatic dysfunction of lumbar region: Secondary | ICD-10-CM | POA: Diagnosis not present

## 2018-08-11 DIAGNOSIS — Z1329 Encounter for screening for other suspected endocrine disorder: Secondary | ICD-10-CM | POA: Diagnosis not present

## 2018-08-11 DIAGNOSIS — Z0000 Encounter for general adult medical examination without abnormal findings: Secondary | ICD-10-CM | POA: Diagnosis not present

## 2018-08-11 DIAGNOSIS — Z13220 Encounter for screening for lipoid disorders: Secondary | ICD-10-CM | POA: Diagnosis not present

## 2018-08-11 DIAGNOSIS — Z114 Encounter for screening for human immunodeficiency virus [HIV]: Secondary | ICD-10-CM | POA: Diagnosis not present

## 2018-08-16 DIAGNOSIS — Z0000 Encounter for general adult medical examination without abnormal findings: Secondary | ICD-10-CM | POA: Diagnosis not present

## 2018-08-16 DIAGNOSIS — Z6823 Body mass index (BMI) 23.0-23.9, adult: Secondary | ICD-10-CM | POA: Diagnosis not present

## 2018-08-29 DIAGNOSIS — L812 Freckles: Secondary | ICD-10-CM | POA: Diagnosis not present

## 2018-08-29 DIAGNOSIS — S0911XA Strain of muscle and tendon of head, initial encounter: Secondary | ICD-10-CM | POA: Diagnosis not present

## 2018-08-29 DIAGNOSIS — K219 Gastro-esophageal reflux disease without esophagitis: Secondary | ICD-10-CM | POA: Diagnosis not present

## 2018-08-29 DIAGNOSIS — Z6824 Body mass index (BMI) 24.0-24.9, adult: Secondary | ICD-10-CM | POA: Diagnosis not present

## 2018-09-28 IMAGING — US US ABDOMEN LIMITED
1 series · 14 of 25 positions shown · non-contrast
Comparison: None.

CLINICAL DATA: Right upper quadrant pain, nausea, vomiting

EXAM:
US ABDOMEN LIMITED - RIGHT UPPER QUADRANT

[Series 1: us abdomen limited · 0.19mm/px · 14 of 40 slices shown]
[im 1/40]
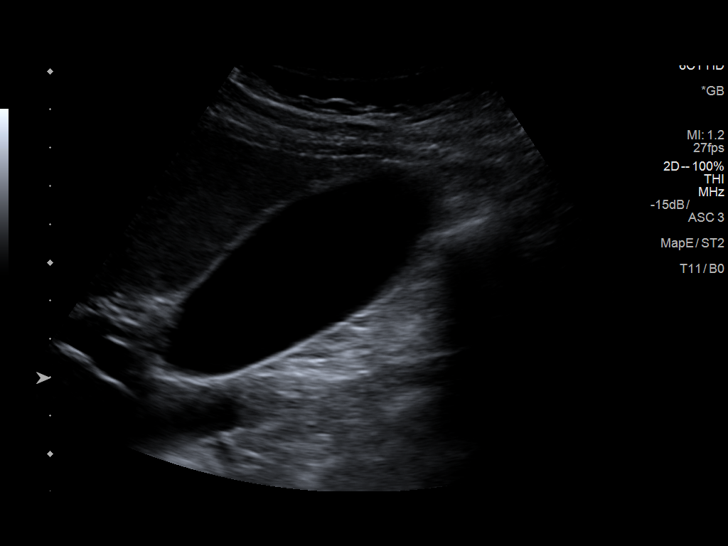
[im 4/40]
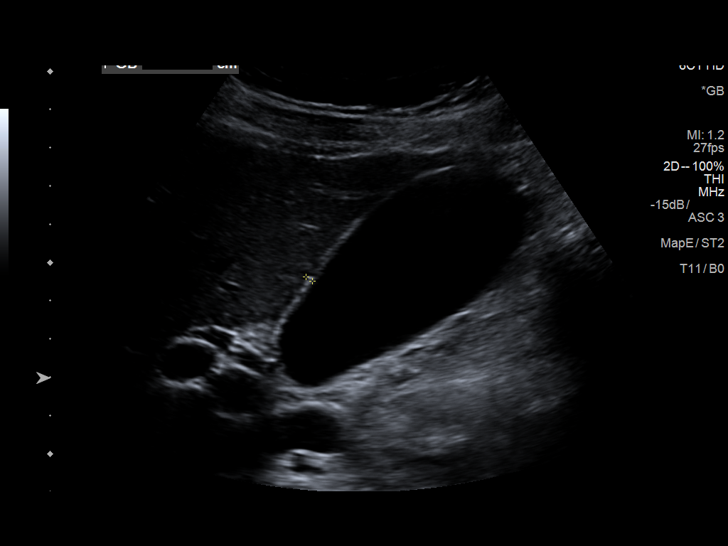
[im 7/40]
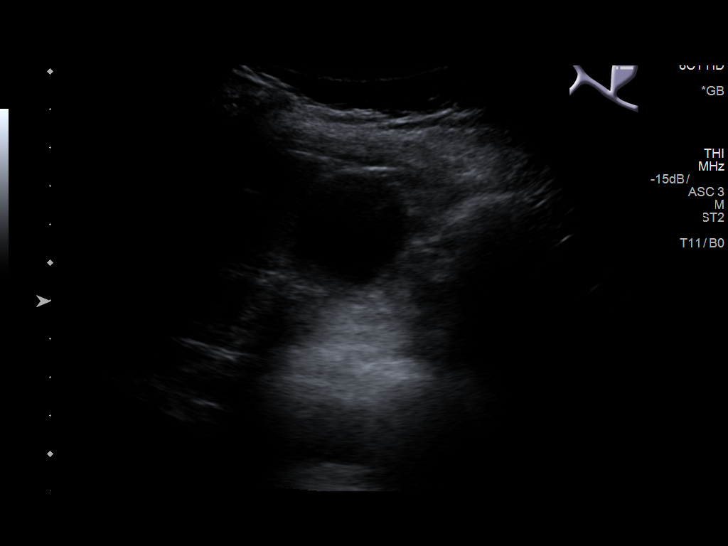
[im 10/40]
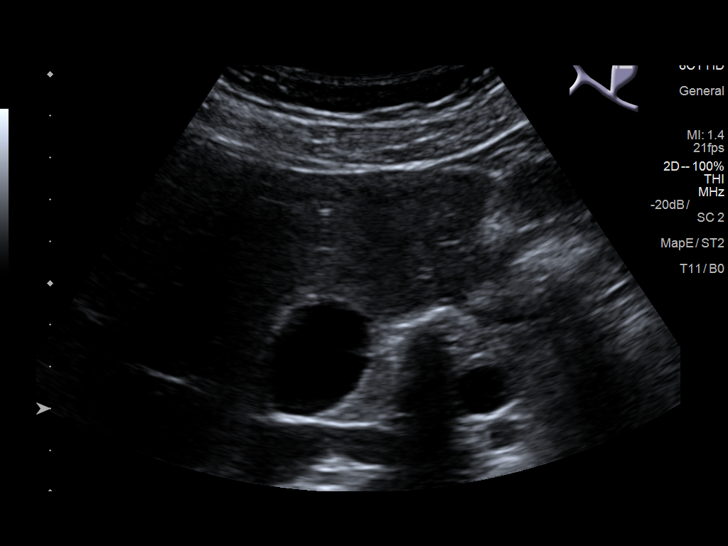
[im 14/40]
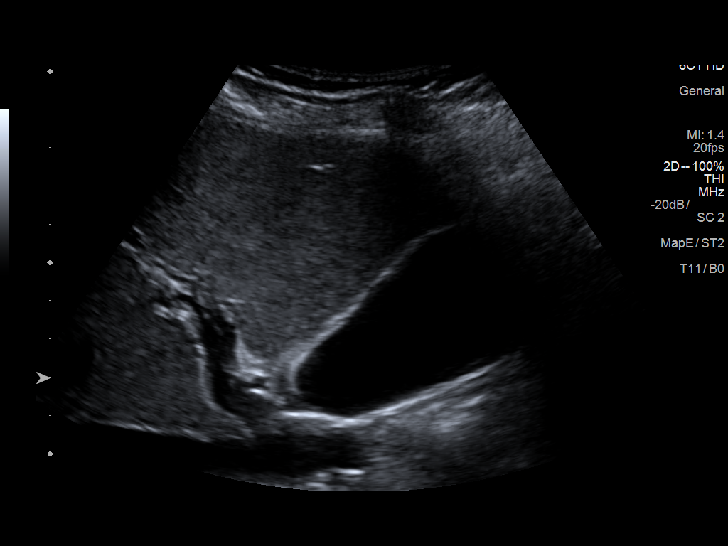
[im 15/40]
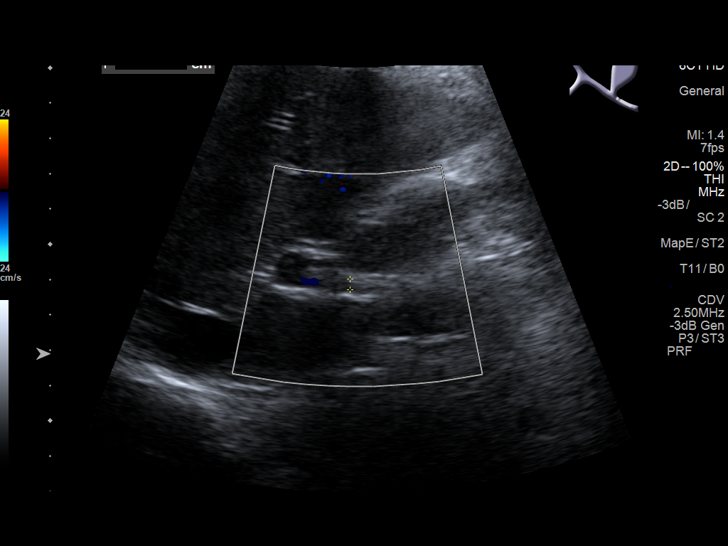
[im 18/40]
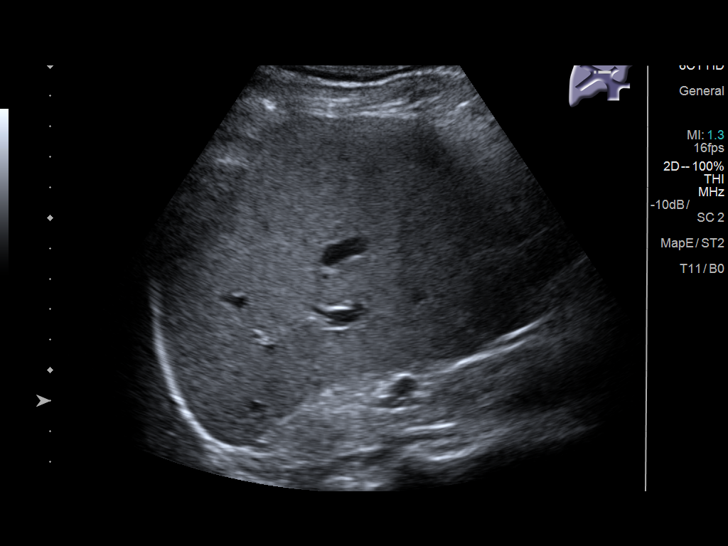
[im 22/40]
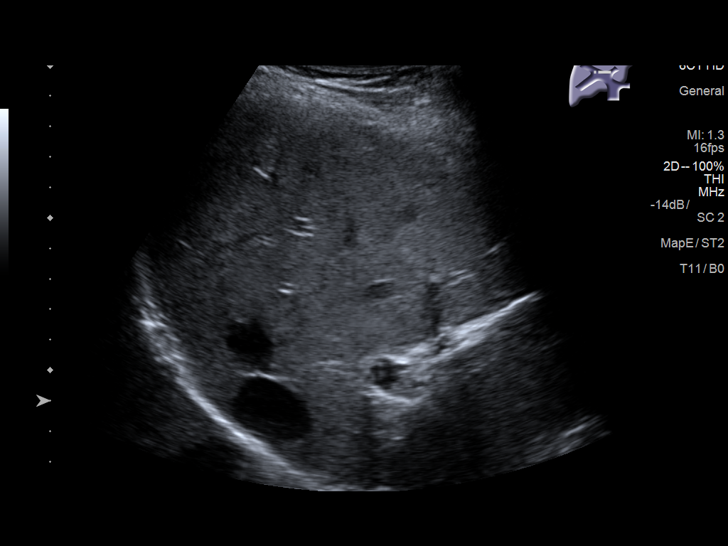
[im 25/40]
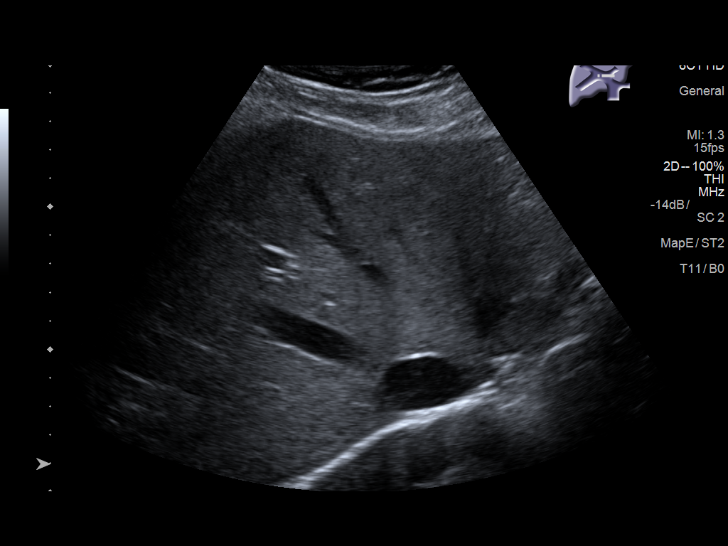
[im 27/40]
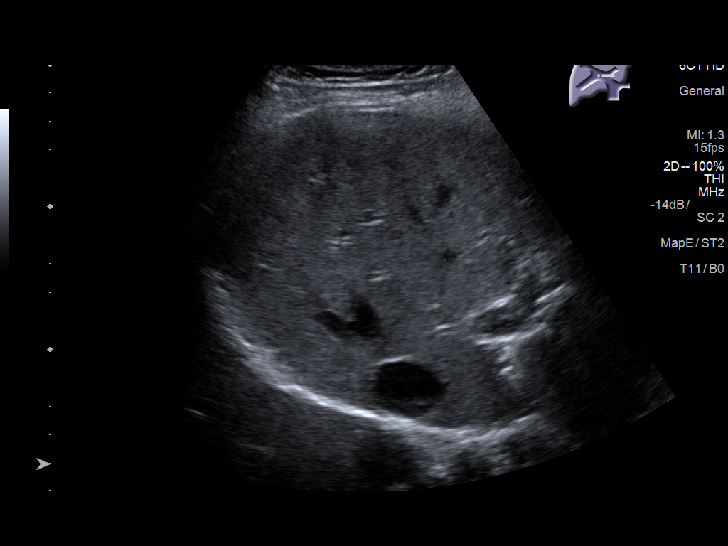
[im 30/40]
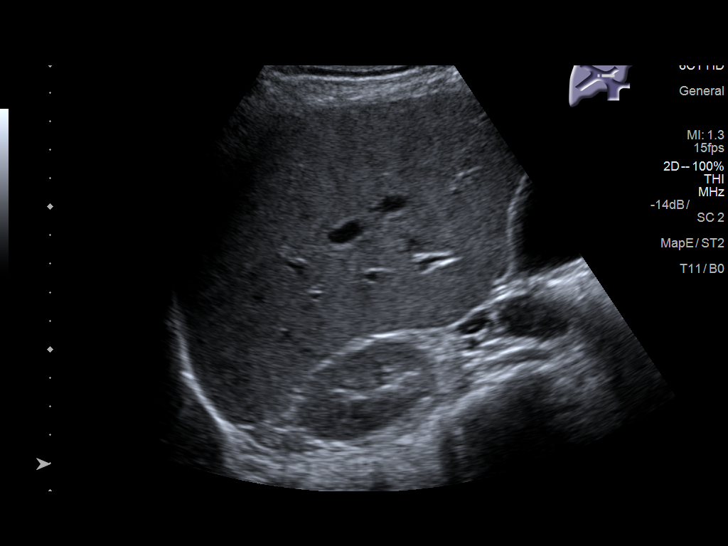
[im 33/40]
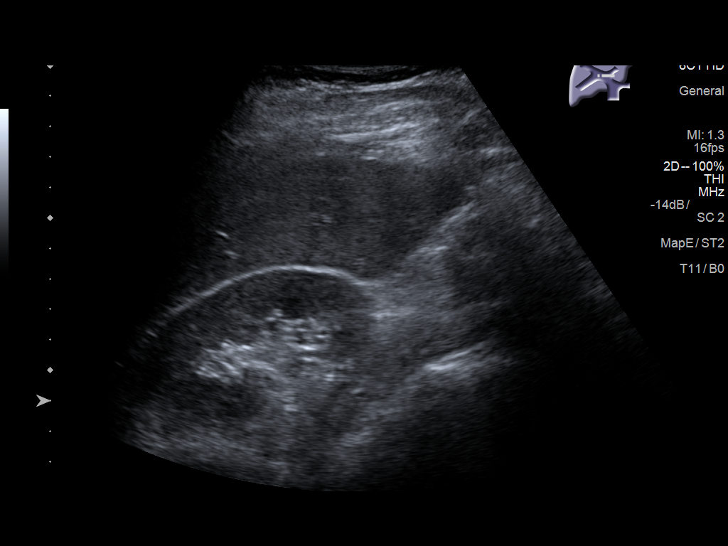
[im 36/40]
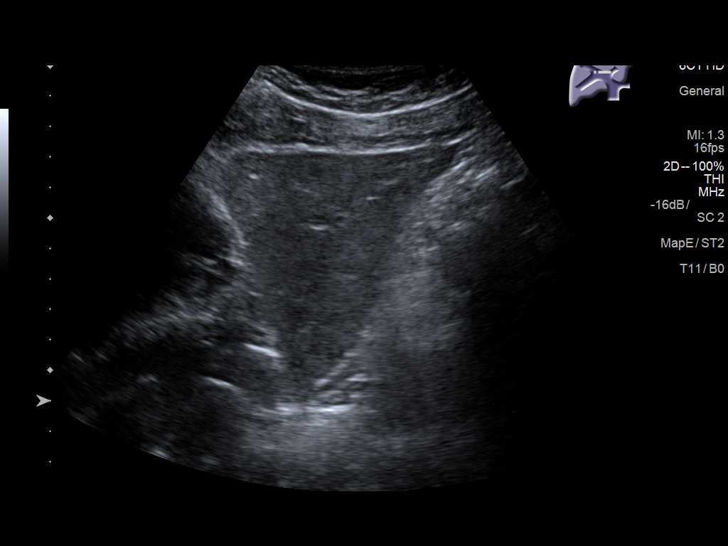
[im 40/40]
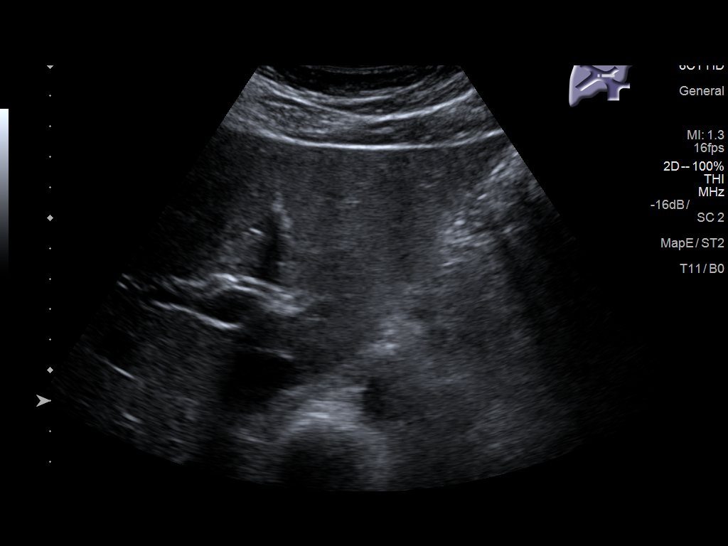

[14 of 25 positions shown; findings below may reference images not displayed]

FINDINGS: Gallbladder:

No gallstones or wall thickening visualized. No sonographic Murphy
sign noted by sonographer.

Common bile duct:

Diameter: Normal caliber, 3 mm

Liver:

No focal lesion identified. Within normal limits in parenchymal
echogenicity.
IMPRESSION: Normal study

## 2019-02-18 DIAGNOSIS — R0789 Other chest pain: Secondary | ICD-10-CM | POA: Diagnosis not present

## 2019-02-18 DIAGNOSIS — R002 Palpitations: Secondary | ICD-10-CM | POA: Diagnosis not present

## 2019-02-18 DIAGNOSIS — F439 Reaction to severe stress, unspecified: Secondary | ICD-10-CM | POA: Diagnosis not present

## 2019-02-18 DIAGNOSIS — Z733 Stress, not elsewhere classified: Secondary | ICD-10-CM | POA: Diagnosis not present

## 2019-05-04 DIAGNOSIS — F411 Generalized anxiety disorder: Secondary | ICD-10-CM | POA: Diagnosis not present

## 2019-06-01 DIAGNOSIS — R5383 Other fatigue: Secondary | ICD-10-CM | POA: Diagnosis not present

## 2019-06-01 DIAGNOSIS — F411 Generalized anxiety disorder: Secondary | ICD-10-CM | POA: Diagnosis not present

## 2019-06-01 DIAGNOSIS — G4700 Insomnia, unspecified: Secondary | ICD-10-CM | POA: Diagnosis not present

## 2019-07-20 DIAGNOSIS — J309 Allergic rhinitis, unspecified: Secondary | ICD-10-CM | POA: Diagnosis not present

## 2019-07-20 DIAGNOSIS — Z6823 Body mass index (BMI) 23.0-23.9, adult: Secondary | ICD-10-CM | POA: Diagnosis not present

## 2019-07-20 DIAGNOSIS — F411 Generalized anxiety disorder: Secondary | ICD-10-CM | POA: Diagnosis not present

## 2019-07-20 DIAGNOSIS — B009 Herpesviral infection, unspecified: Secondary | ICD-10-CM | POA: Diagnosis not present

## 2019-09-18 IMAGING — US US SCROTUM W/ DOPPLER COMPLETE
2 series · 13 of 25 positions shown · non-contrast
Comparison: None.

CLINICAL DATA: Right scrotal mass superiorly for 1 week

EXAM:
SCROTAL ULTRASOUND
DOPPLER ULTRASOUND OF THE TESTICLES
TECHNIQUE: Complete ultrasound examination of the testicles, epididymis, and
other scrotal structures was performed. Color and spectral Doppler
ultrasound were also utilized to evaluate blood flow to the
testicles.

[Series 1: us scrotum w/ doppler complete · 0.08mm/px · 12 of 54 slices shown (1 of 2)]
[im 1/54]
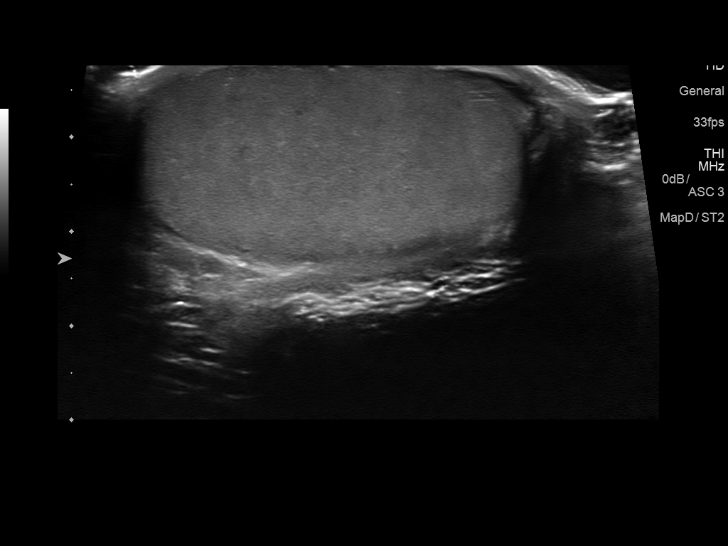
[im 5/54]
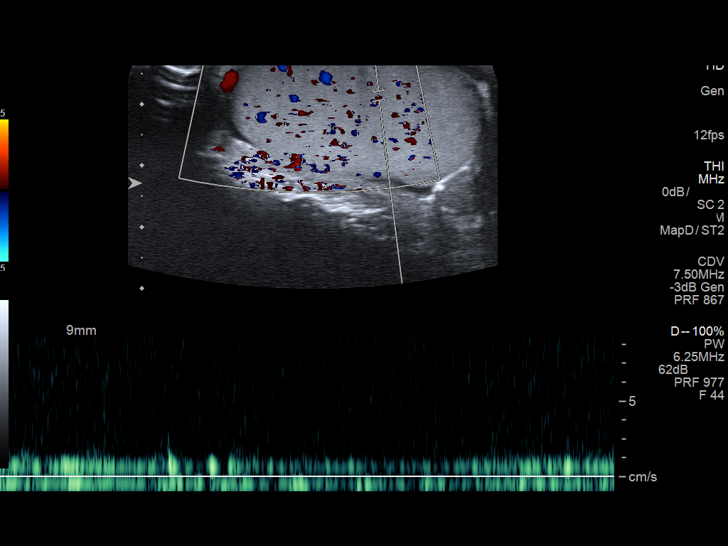
[im 10/54]
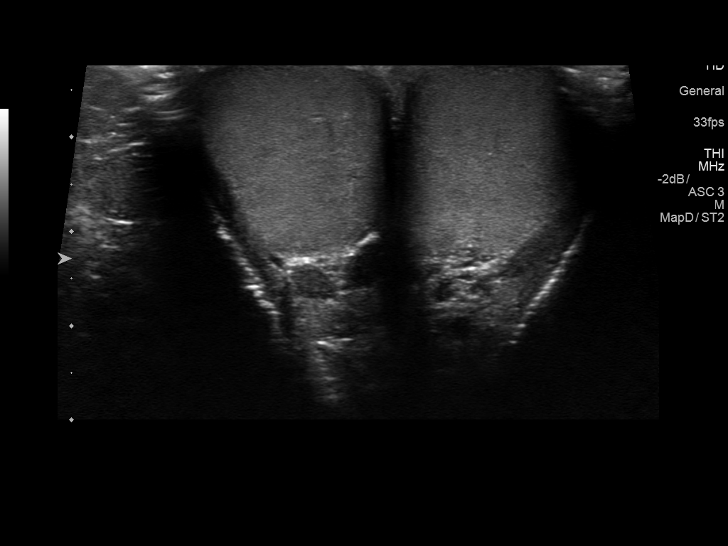
[im 14/54]
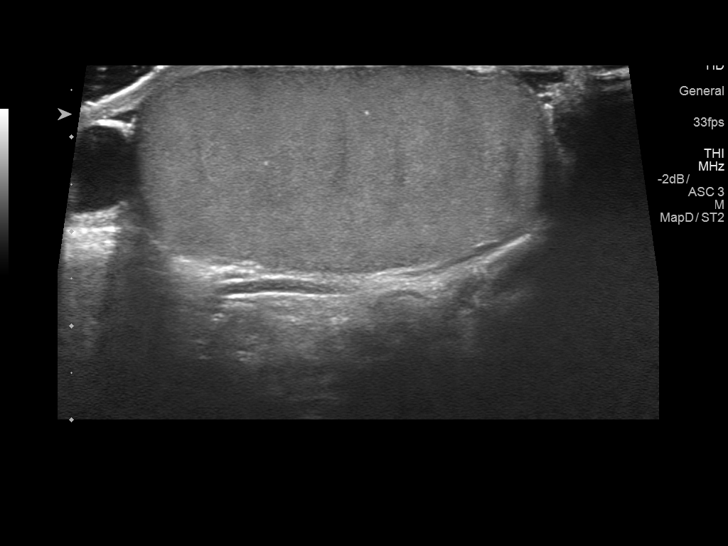
[im 19/54]
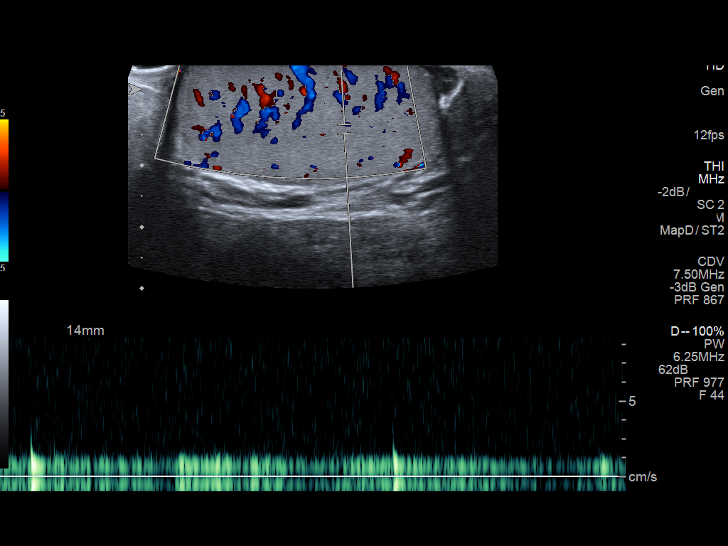
[im 24/54]
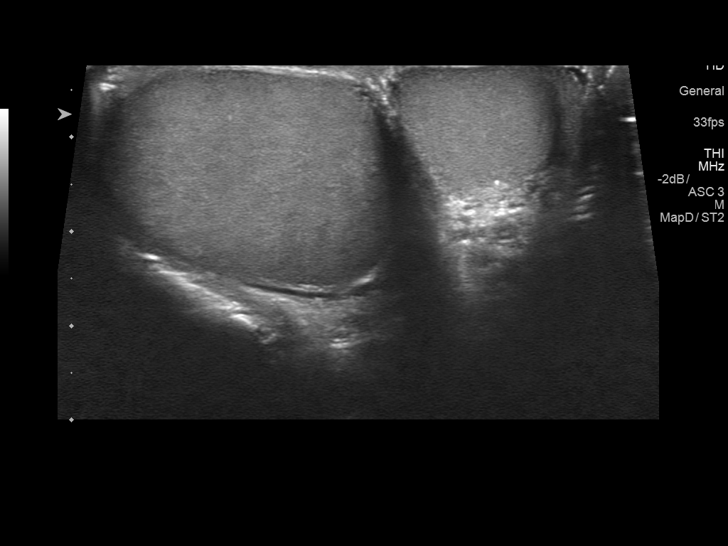
[im 28/54]
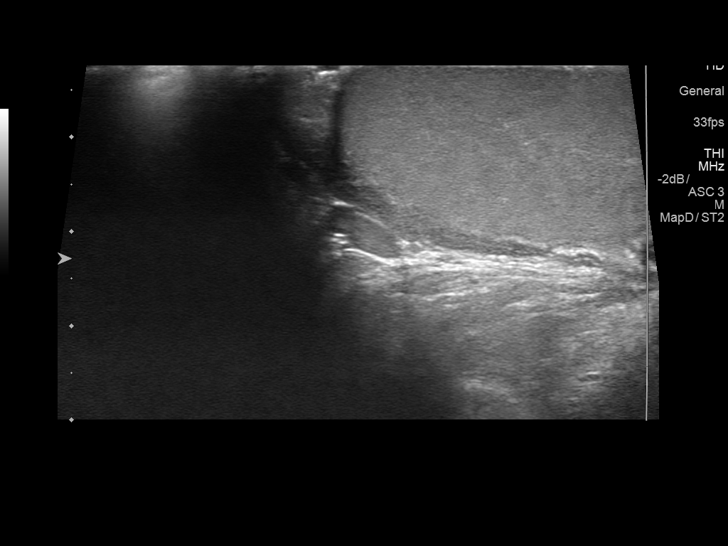
[im 33/54]
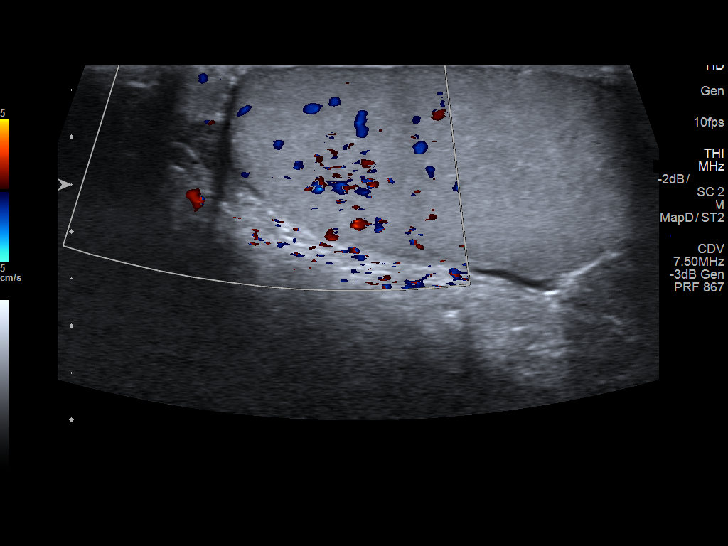
[im 37/54]
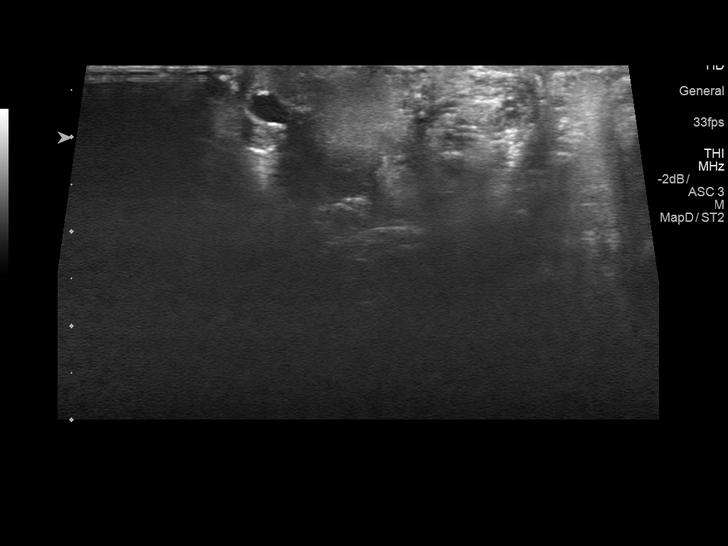
[im 42/54]
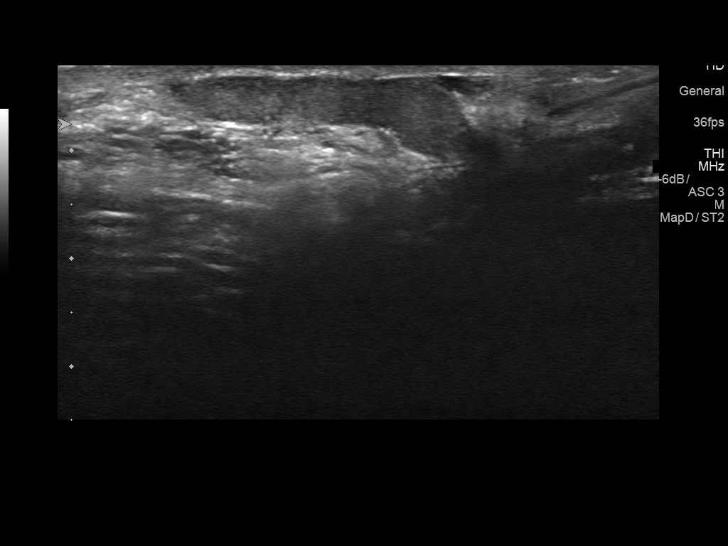
[im 47/54]
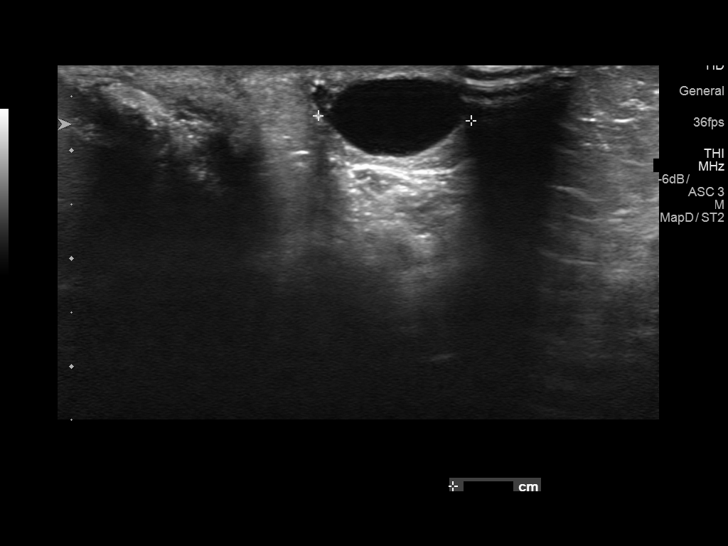
[im 51/54]
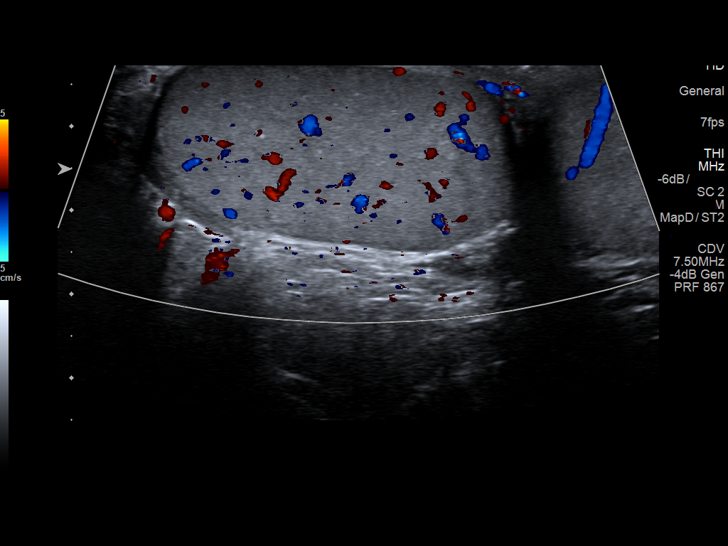

[Series 2001: us scrotum w/ doppler complete · 0.08mm/px · 1 of 1 slices shown (2 of 2)]
[im 1/1]
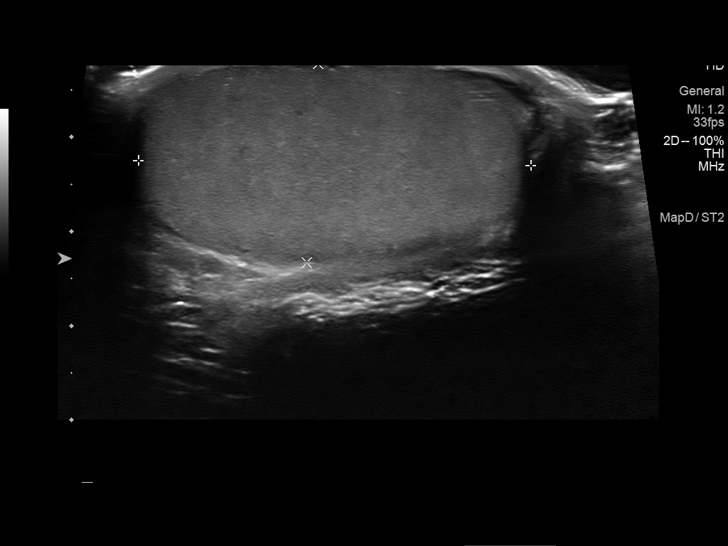

[13 of 25 positions shown; findings below may reference images not displayed]

FINDINGS: Right testicle

Measurements: 4.2 x 2.1 x 2.9 cm. No mass or microlithiasis
visualized.

Left testicle

Measurements: 4.4 x 2.2 x 2.3 cm. No mass or microlithiasis
visualized.

Right epididymis:  Small cysts in the epididymal head, 4 mm.

Left epididymis:  Small cyst in the epididymal head, 14 mm.

Hydrocele:  None visualized.

Varicocele:  None visualized.

Pulsed Doppler interrogation of both testes demonstrates normal low
resistance arterial and venous waveforms bilaterally.

Ultrasound in the area of reported palpable abnormality demonstrates
no visible abnormality.
IMPRESSION: Small epididymal head cysts bilaterally, left larger than right.

No testicular abnormality.

No acute findings.

## 2019-10-31 DIAGNOSIS — F411 Generalized anxiety disorder: Secondary | ICD-10-CM | POA: Diagnosis not present

## 2019-12-20 DIAGNOSIS — M79661 Pain in right lower leg: Secondary | ICD-10-CM | POA: Diagnosis not present

## 2020-02-12 DIAGNOSIS — Z20828 Contact with and (suspected) exposure to other viral communicable diseases: Secondary | ICD-10-CM | POA: Diagnosis not present

## 2020-03-27 DIAGNOSIS — T63441A Toxic effect of venom of bees, accidental (unintentional), initial encounter: Secondary | ICD-10-CM | POA: Diagnosis not present

## 2020-04-10 DIAGNOSIS — R42 Dizziness and giddiness: Secondary | ICD-10-CM | POA: Diagnosis not present

## 2020-04-10 DIAGNOSIS — G44209 Tension-type headache, unspecified, not intractable: Secondary | ICD-10-CM | POA: Diagnosis not present

## 2020-04-10 DIAGNOSIS — R5383 Other fatigue: Secondary | ICD-10-CM | POA: Diagnosis not present

## 2020-04-10 DIAGNOSIS — R195 Other fecal abnormalities: Secondary | ICD-10-CM | POA: Diagnosis not present

## 2020-04-12 DIAGNOSIS — Z20822 Contact with and (suspected) exposure to covid-19: Secondary | ICD-10-CM | POA: Diagnosis not present

## 2020-04-13 DIAGNOSIS — Z20822 Contact with and (suspected) exposure to covid-19: Secondary | ICD-10-CM | POA: Diagnosis not present

## 2020-04-17 DIAGNOSIS — F4322 Adjustment disorder with anxiety: Secondary | ICD-10-CM | POA: Diagnosis not present

## 2020-04-18 DIAGNOSIS — R251 Tremor, unspecified: Secondary | ICD-10-CM | POA: Diagnosis not present

## 2020-04-18 DIAGNOSIS — F331 Major depressive disorder, recurrent, moderate: Secondary | ICD-10-CM | POA: Diagnosis not present

## 2020-04-18 DIAGNOSIS — F411 Generalized anxiety disorder: Secondary | ICD-10-CM | POA: Diagnosis not present

## 2020-04-18 DIAGNOSIS — G4700 Insomnia, unspecified: Secondary | ICD-10-CM | POA: Diagnosis not present

## 2020-04-22 DIAGNOSIS — F4322 Adjustment disorder with anxiety: Secondary | ICD-10-CM | POA: Diagnosis not present

## 2020-04-29 DIAGNOSIS — F4322 Adjustment disorder with anxiety: Secondary | ICD-10-CM | POA: Diagnosis not present

## 2020-05-07 DIAGNOSIS — F4322 Adjustment disorder with anxiety: Secondary | ICD-10-CM | POA: Diagnosis not present

## 2020-05-16 DIAGNOSIS — K047 Periapical abscess without sinus: Secondary | ICD-10-CM | POA: Diagnosis not present

## 2020-05-20 DIAGNOSIS — F4322 Adjustment disorder with anxiety: Secondary | ICD-10-CM | POA: Diagnosis not present

## 2020-05-28 DIAGNOSIS — F4322 Adjustment disorder with anxiety: Secondary | ICD-10-CM | POA: Diagnosis not present

## 2020-05-29 DIAGNOSIS — Z20822 Contact with and (suspected) exposure to covid-19: Secondary | ICD-10-CM | POA: Diagnosis not present

## 2020-05-30 DIAGNOSIS — F331 Major depressive disorder, recurrent, moderate: Secondary | ICD-10-CM | POA: Diagnosis not present

## 2020-05-30 DIAGNOSIS — F411 Generalized anxiety disorder: Secondary | ICD-10-CM | POA: Diagnosis not present

## 2020-05-30 DIAGNOSIS — G4700 Insomnia, unspecified: Secondary | ICD-10-CM | POA: Diagnosis not present

## 2020-05-30 DIAGNOSIS — Z20822 Contact with and (suspected) exposure to covid-19: Secondary | ICD-10-CM | POA: Diagnosis not present

## 2020-05-30 DIAGNOSIS — Z7185 Encounter for immunization safety counseling: Secondary | ICD-10-CM | POA: Diagnosis not present

## 2020-06-04 DIAGNOSIS — F4322 Adjustment disorder with anxiety: Secondary | ICD-10-CM | POA: Diagnosis not present

## 2020-08-05 DIAGNOSIS — Z20822 Contact with and (suspected) exposure to covid-19: Secondary | ICD-10-CM | POA: Diagnosis not present

## 2020-08-13 DIAGNOSIS — F4322 Adjustment disorder with anxiety: Secondary | ICD-10-CM | POA: Diagnosis not present

## 2020-09-03 DIAGNOSIS — F4322 Adjustment disorder with anxiety: Secondary | ICD-10-CM | POA: Diagnosis not present

## 2020-09-17 DIAGNOSIS — F4322 Adjustment disorder with anxiety: Secondary | ICD-10-CM | POA: Diagnosis not present

## 2020-10-30 DIAGNOSIS — R1013 Epigastric pain: Secondary | ICD-10-CM | POA: Diagnosis not present

## 2020-10-30 DIAGNOSIS — F419 Anxiety disorder, unspecified: Secondary | ICD-10-CM | POA: Diagnosis not present

## 2020-11-06 DIAGNOSIS — G4700 Insomnia, unspecified: Secondary | ICD-10-CM | POA: Diagnosis not present

## 2020-11-06 DIAGNOSIS — F331 Major depressive disorder, recurrent, moderate: Secondary | ICD-10-CM | POA: Diagnosis not present

## 2020-11-06 DIAGNOSIS — F411 Generalized anxiety disorder: Secondary | ICD-10-CM | POA: Diagnosis not present

## 2020-11-06 DIAGNOSIS — B009 Herpesviral infection, unspecified: Secondary | ICD-10-CM | POA: Diagnosis not present

## 2021-03-05 DIAGNOSIS — F411 Generalized anxiety disorder: Secondary | ICD-10-CM | POA: Diagnosis not present

## 2021-03-05 DIAGNOSIS — F331 Major depressive disorder, recurrent, moderate: Secondary | ICD-10-CM | POA: Diagnosis not present

## 2021-03-05 DIAGNOSIS — G4700 Insomnia, unspecified: Secondary | ICD-10-CM | POA: Diagnosis not present

## 2021-03-05 DIAGNOSIS — H60332 Swimmer's ear, left ear: Secondary | ICD-10-CM | POA: Diagnosis not present

## 2021-03-21 DIAGNOSIS — J0100 Acute maxillary sinusitis, unspecified: Secondary | ICD-10-CM | POA: Diagnosis not present

## 2021-03-21 DIAGNOSIS — H6693 Otitis media, unspecified, bilateral: Secondary | ICD-10-CM | POA: Diagnosis not present

## 2021-08-06 DIAGNOSIS — J020 Streptococcal pharyngitis: Secondary | ICD-10-CM | POA: Diagnosis not present

## 2021-11-06 DIAGNOSIS — K5900 Constipation, unspecified: Secondary | ICD-10-CM | POA: Diagnosis not present

## 2021-11-06 DIAGNOSIS — N419 Inflammatory disease of prostate, unspecified: Secondary | ICD-10-CM | POA: Diagnosis not present

## 2021-11-06 DIAGNOSIS — N390 Urinary tract infection, site not specified: Secondary | ICD-10-CM | POA: Diagnosis not present

## 2021-11-11 DIAGNOSIS — Z0000 Encounter for general adult medical examination without abnormal findings: Secondary | ICD-10-CM | POA: Diagnosis not present

## 2021-11-11 DIAGNOSIS — Z114 Encounter for screening for human immunodeficiency virus [HIV]: Secondary | ICD-10-CM | POA: Diagnosis not present

## 2021-11-12 DIAGNOSIS — Z0000 Encounter for general adult medical examination without abnormal findings: Secondary | ICD-10-CM | POA: Diagnosis not present

## 2021-11-12 DIAGNOSIS — Z23 Encounter for immunization: Secondary | ICD-10-CM | POA: Diagnosis not present

## 2022-01-07 DIAGNOSIS — F411 Generalized anxiety disorder: Secondary | ICD-10-CM | POA: Diagnosis not present

## 2022-01-07 DIAGNOSIS — F331 Major depressive disorder, recurrent, moderate: Secondary | ICD-10-CM | POA: Diagnosis not present

## 2022-01-07 DIAGNOSIS — G4700 Insomnia, unspecified: Secondary | ICD-10-CM | POA: Diagnosis not present

## 2022-05-26 DIAGNOSIS — F411 Generalized anxiety disorder: Secondary | ICD-10-CM | POA: Diagnosis not present

## 2022-05-26 DIAGNOSIS — F331 Major depressive disorder, recurrent, moderate: Secondary | ICD-10-CM | POA: Diagnosis not present

## 2022-05-26 DIAGNOSIS — G4700 Insomnia, unspecified: Secondary | ICD-10-CM | POA: Diagnosis not present

## 2022-05-26 DIAGNOSIS — E782 Mixed hyperlipidemia: Secondary | ICD-10-CM | POA: Diagnosis not present

## 2023-04-07 DIAGNOSIS — E782 Mixed hyperlipidemia: Secondary | ICD-10-CM | POA: Diagnosis not present

## 2023-04-07 DIAGNOSIS — Z114 Encounter for screening for human immunodeficiency virus [HIV]: Secondary | ICD-10-CM | POA: Diagnosis not present

## 2023-04-07 DIAGNOSIS — Z13220 Encounter for screening for lipoid disorders: Secondary | ICD-10-CM | POA: Diagnosis not present

## 2023-04-07 DIAGNOSIS — Z0000 Encounter for general adult medical examination without abnormal findings: Secondary | ICD-10-CM | POA: Diagnosis not present

## 2023-04-09 DIAGNOSIS — Z789 Other specified health status: Secondary | ICD-10-CM | POA: Diagnosis not present

## 2023-04-09 DIAGNOSIS — R030 Elevated blood-pressure reading, without diagnosis of hypertension: Secondary | ICD-10-CM | POA: Diagnosis not present

## 2023-04-09 DIAGNOSIS — E782 Mixed hyperlipidemia: Secondary | ICD-10-CM | POA: Diagnosis not present

## 2023-04-09 DIAGNOSIS — Z0000 Encounter for general adult medical examination without abnormal findings: Secondary | ICD-10-CM | POA: Diagnosis not present

## 2023-05-11 DIAGNOSIS — F331 Major depressive disorder, recurrent, moderate: Secondary | ICD-10-CM | POA: Diagnosis not present

## 2023-05-11 DIAGNOSIS — G4700 Insomnia, unspecified: Secondary | ICD-10-CM | POA: Diagnosis not present

## 2023-05-11 DIAGNOSIS — F411 Generalized anxiety disorder: Secondary | ICD-10-CM | POA: Diagnosis not present

## 2023-06-09 DIAGNOSIS — F411 Generalized anxiety disorder: Secondary | ICD-10-CM | POA: Diagnosis not present

## 2023-06-09 DIAGNOSIS — G4700 Insomnia, unspecified: Secondary | ICD-10-CM | POA: Diagnosis not present

## 2023-06-09 DIAGNOSIS — F331 Major depressive disorder, recurrent, moderate: Secondary | ICD-10-CM | POA: Diagnosis not present

## 2024-05-19 DIAGNOSIS — G4700 Insomnia, unspecified: Secondary | ICD-10-CM | POA: Diagnosis not present

## 2024-05-19 DIAGNOSIS — F331 Major depressive disorder, recurrent, moderate: Secondary | ICD-10-CM | POA: Diagnosis not present

## 2024-05-19 DIAGNOSIS — F411 Generalized anxiety disorder: Secondary | ICD-10-CM | POA: Diagnosis not present

## 2024-05-19 DIAGNOSIS — N419 Inflammatory disease of prostate, unspecified: Secondary | ICD-10-CM | POA: Diagnosis not present
